# Patient Record
Sex: Male | Born: 1950 | Race: Black or African American | Hispanic: No | Smoking: Former smoker
Health system: Southern US, Community
[De-identification: ages and names within clinical notes are randomized; demographics above are authoritative.]

## PROBLEM LIST (undated history)

## (undated) DIAGNOSIS — T8859XA Other complications of anesthesia, initial encounter: Secondary | ICD-10-CM

## (undated) DIAGNOSIS — E785 Hyperlipidemia, unspecified: Secondary | ICD-10-CM

## (undated) DIAGNOSIS — F329 Major depressive disorder, single episode, unspecified: Secondary | ICD-10-CM

## (undated) DIAGNOSIS — N529 Male erectile dysfunction, unspecified: Secondary | ICD-10-CM

## (undated) DIAGNOSIS — R000 Tachycardia, unspecified: Secondary | ICD-10-CM

## (undated) DIAGNOSIS — I10 Essential (primary) hypertension: Secondary | ICD-10-CM

## (undated) DIAGNOSIS — R809 Proteinuria, unspecified: Secondary | ICD-10-CM

## (undated) DIAGNOSIS — C61 Malignant neoplasm of prostate: Secondary | ICD-10-CM

## (undated) DIAGNOSIS — G4733 Obstructive sleep apnea (adult) (pediatric): Secondary | ICD-10-CM

## (undated) DIAGNOSIS — L509 Urticaria, unspecified: Secondary | ICD-10-CM

## (undated) DIAGNOSIS — D649 Anemia, unspecified: Secondary | ICD-10-CM

## (undated) DIAGNOSIS — H409 Unspecified glaucoma: Secondary | ICD-10-CM

## (undated) DIAGNOSIS — H9190 Unspecified hearing loss, unspecified ear: Secondary | ICD-10-CM

## (undated) DIAGNOSIS — Z803 Family history of malignant neoplasm of breast: Secondary | ICD-10-CM

## (undated) DIAGNOSIS — C669 Malignant neoplasm of unspecified ureter: Secondary | ICD-10-CM

## (undated) DIAGNOSIS — F32A Depression, unspecified: Secondary | ICD-10-CM

## (undated) DIAGNOSIS — E119 Type 2 diabetes mellitus without complications: Secondary | ICD-10-CM

## (undated) DIAGNOSIS — K635 Polyp of colon: Secondary | ICD-10-CM

## (undated) DIAGNOSIS — K602 Anal fissure, unspecified: Secondary | ICD-10-CM

## (undated) DIAGNOSIS — Z8546 Personal history of malignant neoplasm of prostate: Secondary | ICD-10-CM

## (undated) DIAGNOSIS — Z8042 Family history of malignant neoplasm of prostate: Secondary | ICD-10-CM

## (undated) HISTORY — DX: Family history of malignant neoplasm of breast: Z80.3

## (undated) HISTORY — DX: Family history of malignant neoplasm of prostate: Z80.42

## (undated) HISTORY — DX: Personal history of malignant neoplasm of prostate: Z85.46

## (undated) HISTORY — DX: Tachycardia, unspecified: R00.0

## (undated) HISTORY — DX: Hyperlipidemia, unspecified: E78.5

## (undated) HISTORY — DX: Polyp of colon: K63.5

## (undated) HISTORY — DX: Anal fissure, unspecified: K60.2

## (undated) HISTORY — DX: Anemia, unspecified: D64.9

## (undated) HISTORY — PX: INSERTION PROSTATE RADIATION SEED: SUR718

## (undated) HISTORY — DX: Urticaria, unspecified: L50.9

## (undated) HISTORY — PX: APPENDECTOMY: SHX54

## (undated) HISTORY — DX: Malignant neoplasm of unspecified ureter: C66.9

## (undated) HISTORY — DX: Obstructive sleep apnea (adult) (pediatric): G47.33

## (undated) HISTORY — DX: Major depressive disorder, single episode, unspecified: F32.9

## (undated) HISTORY — DX: Type 2 diabetes mellitus without complications: E11.9

## (undated) HISTORY — DX: Depression, unspecified: F32.A

## (undated) HISTORY — DX: Male erectile dysfunction, unspecified: N52.9

## (undated) HISTORY — DX: Proteinuria, unspecified: R80.9

## (undated) HISTORY — DX: Malignant neoplasm of prostate: C61

## (undated) HISTORY — DX: Unspecified hearing loss, unspecified ear: H91.90

## (undated) HISTORY — PX: INCISIONAL HERNIA REPAIR: SHX193

---

## 1996-05-13 HISTORY — PX: APPENDECTOMY: SHX54

## 1996-05-13 HISTORY — PX: INCISIONAL HERNIA REPAIR: SHX193

## 2000-04-21 ENCOUNTER — Encounter: Admission: RE | Admit: 2000-04-21 | Discharge: 2000-04-21 | Payer: Self-pay | Admitting: Internal Medicine

## 2000-04-21 ENCOUNTER — Encounter: Payer: Self-pay | Admitting: Internal Medicine

## 2008-06-13 DIAGNOSIS — C61 Malignant neoplasm of prostate: Secondary | ICD-10-CM

## 2008-06-13 HISTORY — DX: Malignant neoplasm of prostate: C61

## 2008-10-15 ENCOUNTER — Ambulatory Visit: Admission: RE | Admit: 2008-10-15 | Discharge: 2008-12-10 | Payer: Self-pay | Admitting: Radiation Oncology

## 2008-12-01 ENCOUNTER — Ambulatory Visit (HOSPITAL_BASED_OUTPATIENT_CLINIC_OR_DEPARTMENT_OTHER): Admission: RE | Admit: 2008-12-01 | Discharge: 2008-12-02 | Payer: Self-pay | Admitting: Urology

## 2008-12-11 ENCOUNTER — Ambulatory Visit: Admission: RE | Admit: 2008-12-11 | Discharge: 2009-01-14 | Payer: Self-pay | Admitting: Radiation Oncology

## 2009-11-26 ENCOUNTER — Ambulatory Visit (HOSPITAL_COMMUNITY): Admission: RE | Admit: 2009-11-26 | Discharge: 2009-11-26 | Payer: Self-pay | Admitting: Gastroenterology

## 2010-09-20 LAB — COMPREHENSIVE METABOLIC PANEL
ALT: 35 U/L (ref 0–53)
AST: 32 U/L (ref 0–37)
Albumin: 3.9 g/dL (ref 3.5–5.2)
Alkaline Phosphatase: 85 U/L (ref 39–117)
BUN: 10 mg/dL (ref 6–23)
CO2: 23 mEq/L (ref 19–32)
Calcium: 9.1 mg/dL (ref 8.4–10.5)
Chloride: 106 mEq/L (ref 96–112)
Creatinine, Ser: 1 mg/dL (ref 0.4–1.5)
GFR calc Af Amer: >60 mL/min (ref >60)
GFR calc non Af Amer: >60 mL/min (ref >60)
Glucose, Bld: 149 mg/dL — ABNORMAL HIGH (ref 70–99)
Potassium: 3.8 mEq/L (ref 3.5–5.1)
Sodium: 138 mEq/L (ref 135–145)
Total Bilirubin: 0.6 mg/dL (ref 0.3–1.2)
Total Protein: 8.2 g/dL (ref 6.0–8.3)

## 2010-09-20 LAB — GLUCOSE, CAPILLARY: Glucose-Capillary: 160 mg/dL — ABNORMAL HIGH (ref 70–99)

## 2010-10-26 NOTE — Op Note (Signed)
NAMESYLYS, SOBIERAJ                ACCOUNT NO.:  [id number]   MEDICAL RECORD NO.:  [id number]          PATIENT TYPE:  AMB   LOCATION:  [facility]                         FACILITY:  [facility]   PHYSICIAN:  Hanley Ben, M.D.  DATE OF BIRTH:  1950-06-25   DATE OF PROCEDURE:  12/01/2008  DATE OF DISCHARGE:                               OPERATIVE REPORT   PREOPERATIVE DIAGNOSIS:  Adenocarcinoma of prostate.   POSTOPERATIVE DIAGNOSIS:  Adenocarcinoma of prostate.   PROCEDURE:  I-125 seeds implantation and cystoscopy.   SURGEON:  Arvil Persons, M.D. and Rexene Edison, M.D.   ANESTHESIA:  General.   INDICATIONS:  The patient is a 60 year old male who was found by biopsy  of prostate for elevated PSA to have adenocarcinoma of the prostate and  Gleason score 6.  The treatment options were discussed with him and he  chose to take to have brachytherapy.  He is scheduled today for seeds  implantation.   The patient was identified by his wrist band and proper time-out was  taken.   DESCRIPTION OF PROCEDURE:  Under general anesthesia he was prepped and  draped and placed in the dorsal lithotomy position.  Ultrasound planning  was done by Dr. Valere Dross and when planning was completed, with the  Nucletron and under ultrasound guidance,  a total of 77 seeds were  implanted in the prostate through 30 needles.  Fluoroscopy showed good  seed distribution.  The Foley catheter was then removed.  A flexible  cystoscope was then passed in the bladder.  The anterior urethra is  normal.  There is moderate prostatic hypertrophy.  The bladder mucosa is  normal.  There is no stone, seed or tumor in the bladder.  The ureteral  orifices are in normal position and shape.  The cystoscope was then  removed.  A #16 Foley catheter was then inserted in the bladder.   The patient tolerated the procedure well and left the OR in satisfactory  condition to post anesthesia care unit.      Hanley Ben, M.D.  Electronically Signed     MN/MEDQ  D:  12/01/2008  T:  12/01/2008  Job:  [id number]

## 2011-03-21 ENCOUNTER — Other Ambulatory Visit: Payer: Self-pay | Admitting: Internal Medicine

## 2011-04-05 ENCOUNTER — Ambulatory Visit
Admission: RE | Admit: 2011-04-05 | Discharge: 2011-04-05 | Disposition: A | Discharge status: Home / Self Care | Payer: Medicare Other | Source: Ambulatory Visit | Attending: Internal Medicine | Admitting: Internal Medicine

## 2011-08-22 DIAGNOSIS — H40019 Open angle with borderline findings, low risk, unspecified eye: Secondary | ICD-10-CM | POA: Diagnosis not present

## 2011-08-22 DIAGNOSIS — H43399 Other vitreous opacities, unspecified eye: Secondary | ICD-10-CM | POA: Diagnosis not present

## 2011-08-22 DIAGNOSIS — E119 Type 2 diabetes mellitus without complications: Secondary | ICD-10-CM | POA: Diagnosis not present

## 2011-08-22 DIAGNOSIS — H2510 Age-related nuclear cataract, unspecified eye: Secondary | ICD-10-CM | POA: Diagnosis not present

## 2011-09-19 DIAGNOSIS — G4733 Obstructive sleep apnea (adult) (pediatric): Secondary | ICD-10-CM | POA: Diagnosis not present

## 2011-09-19 DIAGNOSIS — E119 Type 2 diabetes mellitus without complications: Secondary | ICD-10-CM | POA: Diagnosis not present

## 2011-10-26 DIAGNOSIS — C61 Malignant neoplasm of prostate: Secondary | ICD-10-CM | POA: Diagnosis not present

## 2011-11-02 DIAGNOSIS — N529 Male erectile dysfunction, unspecified: Secondary | ICD-10-CM | POA: Diagnosis not present

## 2011-11-02 DIAGNOSIS — C61 Malignant neoplasm of prostate: Secondary | ICD-10-CM | POA: Diagnosis not present

## 2011-11-30 DIAGNOSIS — R000 Tachycardia, unspecified: Secondary | ICD-10-CM | POA: Diagnosis not present

## 2011-11-30 DIAGNOSIS — Z0389 Encounter for observation for other suspected diseases and conditions ruled out: Secondary | ICD-10-CM | POA: Diagnosis not present

## 2011-11-30 DIAGNOSIS — E7800 Pure hypercholesterolemia, unspecified: Secondary | ICD-10-CM | POA: Diagnosis not present

## 2011-11-30 DIAGNOSIS — E669 Obesity, unspecified: Secondary | ICD-10-CM | POA: Diagnosis not present

## 2012-02-20 DIAGNOSIS — H40019 Open angle with borderline findings, low risk, unspecified eye: Secondary | ICD-10-CM | POA: Diagnosis not present

## 2012-02-20 DIAGNOSIS — E119 Type 2 diabetes mellitus without complications: Secondary | ICD-10-CM | POA: Diagnosis not present

## 2012-03-21 DIAGNOSIS — Z0000 Encounter for general adult medical examination without abnormal findings: Secondary | ICD-10-CM | POA: Diagnosis not present

## 2012-03-21 DIAGNOSIS — Z79899 Other long term (current) drug therapy: Secondary | ICD-10-CM | POA: Diagnosis not present

## 2012-03-21 DIAGNOSIS — E119 Type 2 diabetes mellitus without complications: Secondary | ICD-10-CM | POA: Diagnosis not present

## 2012-03-21 DIAGNOSIS — G4733 Obstructive sleep apnea (adult) (pediatric): Secondary | ICD-10-CM | POA: Diagnosis not present

## 2012-03-21 DIAGNOSIS — K219 Gastro-esophageal reflux disease without esophagitis: Secondary | ICD-10-CM | POA: Diagnosis not present

## 2012-03-21 DIAGNOSIS — Z23 Encounter for immunization: Secondary | ICD-10-CM | POA: Diagnosis not present

## 2012-03-21 DIAGNOSIS — Z1331 Encounter for screening for depression: Secondary | ICD-10-CM | POA: Diagnosis not present

## 2012-05-07 DIAGNOSIS — C61 Malignant neoplasm of prostate: Secondary | ICD-10-CM | POA: Diagnosis not present

## 2012-05-07 DIAGNOSIS — N529 Male erectile dysfunction, unspecified: Secondary | ICD-10-CM | POA: Diagnosis not present

## 2012-08-20 DIAGNOSIS — H40019 Open angle with borderline findings, low risk, unspecified eye: Secondary | ICD-10-CM | POA: Diagnosis not present

## 2012-09-25 DIAGNOSIS — E119 Type 2 diabetes mellitus without complications: Secondary | ICD-10-CM | POA: Diagnosis not present

## 2012-09-25 DIAGNOSIS — R109 Unspecified abdominal pain: Secondary | ICD-10-CM | POA: Diagnosis not present

## 2012-09-25 DIAGNOSIS — G4733 Obstructive sleep apnea (adult) (pediatric): Secondary | ICD-10-CM | POA: Diagnosis not present

## 2012-11-07 DIAGNOSIS — C61 Malignant neoplasm of prostate: Secondary | ICD-10-CM | POA: Diagnosis not present

## 2012-11-07 DIAGNOSIS — N529 Male erectile dysfunction, unspecified: Secondary | ICD-10-CM | POA: Diagnosis not present

## 2012-11-13 DIAGNOSIS — E119 Type 2 diabetes mellitus without complications: Secondary | ICD-10-CM | POA: Diagnosis not present

## 2012-11-13 DIAGNOSIS — K5289 Other specified noninfective gastroenteritis and colitis: Secondary | ICD-10-CM | POA: Diagnosis not present

## 2012-12-05 DIAGNOSIS — E7800 Pure hypercholesterolemia, unspecified: Secondary | ICD-10-CM | POA: Diagnosis not present

## 2012-12-05 DIAGNOSIS — E669 Obesity, unspecified: Secondary | ICD-10-CM | POA: Diagnosis not present

## 2012-12-05 DIAGNOSIS — R000 Tachycardia, unspecified: Secondary | ICD-10-CM | POA: Diagnosis not present

## 2013-02-21 DIAGNOSIS — H43819 Vitreous degeneration, unspecified eye: Secondary | ICD-10-CM | POA: Diagnosis not present

## 2013-02-21 DIAGNOSIS — H2510 Age-related nuclear cataract, unspecified eye: Secondary | ICD-10-CM | POA: Diagnosis not present

## 2013-02-21 DIAGNOSIS — H40019 Open angle with borderline findings, low risk, unspecified eye: Secondary | ICD-10-CM | POA: Diagnosis not present

## 2013-02-21 DIAGNOSIS — E119 Type 2 diabetes mellitus without complications: Secondary | ICD-10-CM | POA: Diagnosis not present

## 2013-03-26 DIAGNOSIS — G4733 Obstructive sleep apnea (adult) (pediatric): Secondary | ICD-10-CM | POA: Diagnosis not present

## 2013-03-26 DIAGNOSIS — Z0000 Encounter for general adult medical examination without abnormal findings: Secondary | ICD-10-CM | POA: Diagnosis not present

## 2013-03-26 DIAGNOSIS — E119 Type 2 diabetes mellitus without complications: Secondary | ICD-10-CM | POA: Diagnosis not present

## 2013-03-26 DIAGNOSIS — R030 Elevated blood-pressure reading, without diagnosis of hypertension: Secondary | ICD-10-CM | POA: Diagnosis not present

## 2013-03-26 DIAGNOSIS — Z1331 Encounter for screening for depression: Secondary | ICD-10-CM | POA: Diagnosis not present

## 2013-06-28 DIAGNOSIS — Z23 Encounter for immunization: Secondary | ICD-10-CM | POA: Diagnosis not present

## 2013-07-11 DIAGNOSIS — E119 Type 2 diabetes mellitus without complications: Secondary | ICD-10-CM | POA: Diagnosis not present

## 2013-09-16 DIAGNOSIS — H40019 Open angle with borderline findings, low risk, unspecified eye: Secondary | ICD-10-CM | POA: Diagnosis not present

## 2013-09-16 DIAGNOSIS — H2510 Age-related nuclear cataract, unspecified eye: Secondary | ICD-10-CM | POA: Diagnosis not present

## 2013-09-16 DIAGNOSIS — E119 Type 2 diabetes mellitus without complications: Secondary | ICD-10-CM | POA: Diagnosis not present

## 2013-10-09 DIAGNOSIS — E119 Type 2 diabetes mellitus without complications: Secondary | ICD-10-CM | POA: Diagnosis not present

## 2013-10-09 DIAGNOSIS — Z713 Dietary counseling and surveillance: Secondary | ICD-10-CM | POA: Diagnosis not present

## 2013-10-09 DIAGNOSIS — Z0000 Encounter for general adult medical examination without abnormal findings: Secondary | ICD-10-CM | POA: Diagnosis not present

## 2013-10-09 DIAGNOSIS — Z6841 Body Mass Index (BMI) 40.0 and over, adult: Secondary | ICD-10-CM | POA: Diagnosis not present

## 2013-10-09 DIAGNOSIS — E669 Obesity, unspecified: Secondary | ICD-10-CM | POA: Diagnosis not present

## 2013-10-09 DIAGNOSIS — G4733 Obstructive sleep apnea (adult) (pediatric): Secondary | ICD-10-CM | POA: Diagnosis not present

## 2013-10-15 ENCOUNTER — Encounter: Payer: Self-pay | Admitting: Cardiology

## 2013-12-03 ENCOUNTER — Ambulatory Visit: Payer: Medicare Other | Admitting: Cardiology

## 2013-12-10 ENCOUNTER — Ambulatory Visit: Payer: Medicare Other | Admitting: Cardiology

## 2013-12-19 DIAGNOSIS — N529 Male erectile dysfunction, unspecified: Secondary | ICD-10-CM | POA: Diagnosis not present

## 2013-12-19 DIAGNOSIS — C61 Malignant neoplasm of prostate: Secondary | ICD-10-CM | POA: Diagnosis not present

## 2013-12-27 ENCOUNTER — Other Ambulatory Visit: Payer: Self-pay

## 2013-12-27 ENCOUNTER — Telehealth: Payer: Self-pay

## 2013-12-27 MED ORDER — DILTIAZEM HCL ER 240 MG PO CP24: 240.0000 mg | ORAL_CAPSULE | Freq: Every day | ORAL | Status: DC

## 2013-12-27 NOTE — Telephone Encounter (Signed)
Yes ok to refill 

## 2014-01-14 ENCOUNTER — Encounter: Payer: Self-pay | Admitting: Cardiology

## 2014-01-14 ENCOUNTER — Ambulatory Visit (INDEPENDENT_AMBULATORY_CARE_PROVIDER_SITE_OTHER): Payer: Medicare Other | Admitting: Cardiology

## 2014-01-14 VITALS — BP 140/72 | HR 96 | Ht 68.0 in | Wt 294.0 lb

## 2014-01-14 DIAGNOSIS — E785 Hyperlipidemia, unspecified: Secondary | ICD-10-CM | POA: Diagnosis not present

## 2014-01-14 DIAGNOSIS — R000 Tachycardia, unspecified: Secondary | ICD-10-CM | POA: Insufficient documentation

## 2014-01-14 NOTE — Patient Instructions (Signed)
The current medical regimen is effective;  continue present plan and medications.  Follow up in 1 year with Dr Skains.  You will receive a letter in the mail 2 months before you are due.  Please call us when you receive this letter to schedule your follow up appointment.  

## 2014-01-14 NOTE — Progress Notes (Signed)
[place]. [address] [place], [place]  [address] Phone: [phone] Fax:  [phone]  Date:  01/14/2014   ID:  Kevin Morgan, Kevin Morgan 05/31/1951, MRN [id number]  PCP:  Horton Finer, MD   History of Present Illness: Kevin Morgan is a 63 y.o. male hyperlipidemia, tachycardia, pulse was 110 at original clinic visit. Echocardiogram was reassuring with normal ejection fraction. Holter monitor (03/2009) showed average heart rate of 97 beats per minute. He states that since the diltiazem he is feeling better. He is not waking up in the middle of night with his heart pounding. Now he is feeling stronger with more "Zip in his step". No chest pain, no syncope, no SOB. Decreased simvastatin to 10mg . Feels real good he says. 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   Wt Readings from Last 3 Encounters:  01/14/14 294 lb (133.358 kg)     Past Medical History  Diagnosis Date  . Sleep apnea, obstructive   . Diabetes mellitus without complication   . Proteinuria   . Prostate cancer 2010    seed implants  . ED (erectile dysfunction)   . Depression   . Urticaria   . Hyperlipidemia   . Anal fissure   . Colon polyps 2008/2011  . Tachycardia   . Anemia   . Hearing loss     No past surgical history on file.  Current Outpatient Prescriptions  Medication Sig Dispense Refill  . aspirin 81 MG tablet Take 81 mg by mouth daily.      Marland Kitchen aspirin-sod bicarb-citric acid (ALKA-SELTZER) 325 MG TBEF tablet Take 325 mg by mouth every 6 (six) hours as needed.      . Calcium Carb-Cholecalciferol (CALCIUM 600 + D PO) Take by mouth.      . cyanocobalamin 1000 MCG tablet Take 100 mcg by mouth daily.      Marland Kitchen diltiazem (DILACOR XR) 240 MG 24 hr capsule Take 240 mg by mouth daily.      Marland Kitchen glimepiride (AMARYL) 2 MG tablet Take 2 mg by mouth daily with breakfast.      . ibuprofen (ADVIL,MOTRIN) 200 MG tablet Take 200 mg by mouth every 6 (six) hours as needed.       . loratadine (CLARITIN) 10 MG tablet Take 10 mg by mouth daily.      . metFORMIN (GLUCOPHAGE) 500 MG tablet Take 500 mg by mouth 2 (two) times daily with a meal.      . omeprazole (PRILOSEC) 20 MG capsule Take 20 mg by mouth daily.      . simvastatin (ZOCOR) 10 MG tablet Take 10 mg by mouth daily.      . tadalafil (CIALIS) 5 MG tablet Take 5 mg by mouth daily as needed for erectile dysfunction.       No current facility-administered medications for this visit.    Allergies:    Allergies  Allergen Reactions  . Cinnamon   . Hydrocodone-Acetaminophen Swelling  . Naprosyn [Naproxen]     Social History:  The patient  reports that he has quit smoking. He does not have any smokeless tobacco history on file.   Family History  Problem Relation Age of Onset  . Cancer - Colon Father   . Cancer - Colon Brother   . Cancer - Colon Maternal Uncle     ROS:  Please see the history of present illness.   Denies any strokelike symptoms, chest pain,  orthopnea, PND   All other systems reviewed and negative.   PHYSICAL EXAM: VS:  BP 140/72  Pulse 96  Ht 5\' 8"  (1.727 m)  Wt 294 lb (133.358 kg)  BMI 44.71 kg/m2 Well nourished, well developed, in no acute distress HEENT: normal, [place]/AT, EOMI Neck: no JVD, normal carotid upstroke, no bruit Cardiac:  normal S1, S2; RRR; no murmur Lungs:  clear to auscultation bilaterally, no wheezing, rhonchi or rales Abd: soft, nontender, no hepatomegaly, no bruitsobese Ext: no edema, 2+ distal pulses Skin: warm and dry GU: deferred Neuro: no focal abnormalities noted, AAO x 3  EKG:  01/14/14-sinus rhythm, 96, nonspecific ST-T wave changes, no change from prior  Echocardiogram: 10/14-normal ejection fraction, structurally normal Holter monitor: 10/14-average heart rate 97 beats per minute. No adverse arrhythmias.  ASSESSMENT AND PLAN:  1. Tachycardia-originally in 110 beats per minute. Medications as above. No changes. 2. Morbid obesity-continue to  encourage weight loss. Decrease carbohydrates. Discussed. He is interested in getting a stair stepper/ climber.  3. Hyperlipidemia-he had decreased his simvastatin to 10 mg because of difficulty tolerating medication dose. He is overall doing well. 4. One year followup  Signed, Candee Furbish, MD [facility]  01/14/2014 10:05 AM

## 2014-02-27 DIAGNOSIS — H2510 Age-related nuclear cataract, unspecified eye: Secondary | ICD-10-CM | POA: Diagnosis not present

## 2014-02-27 DIAGNOSIS — H40019 Open angle with borderline findings, low risk, unspecified eye: Secondary | ICD-10-CM | POA: Diagnosis not present

## 2014-02-27 DIAGNOSIS — E119 Type 2 diabetes mellitus without complications: Secondary | ICD-10-CM | POA: Diagnosis not present

## 2014-03-27 DIAGNOSIS — Z8546 Personal history of malignant neoplasm of prostate: Secondary | ICD-10-CM | POA: Diagnosis not present

## 2014-03-27 DIAGNOSIS — Z0000 Encounter for general adult medical examination without abnormal findings: Secondary | ICD-10-CM | POA: Diagnosis not present

## 2014-03-27 DIAGNOSIS — E119 Type 2 diabetes mellitus without complications: Secondary | ICD-10-CM | POA: Diagnosis not present

## 2014-03-27 DIAGNOSIS — Z1211 Encounter for screening for malignant neoplasm of colon: Secondary | ICD-10-CM | POA: Diagnosis not present

## 2014-03-27 DIAGNOSIS — E6609 Other obesity due to excess calories: Secondary | ICD-10-CM | POA: Diagnosis not present

## 2014-03-27 DIAGNOSIS — Z1389 Encounter for screening for other disorder: Secondary | ICD-10-CM | POA: Diagnosis not present

## 2014-03-27 DIAGNOSIS — E780 Pure hypercholesterolemia: Secondary | ICD-10-CM | POA: Diagnosis not present

## 2014-03-27 DIAGNOSIS — Z136 Encounter for screening for cardiovascular disorders: Secondary | ICD-10-CM | POA: Diagnosis not present

## 2014-03-27 DIAGNOSIS — Z23 Encounter for immunization: Secondary | ICD-10-CM | POA: Diagnosis not present

## 2014-04-08 ENCOUNTER — Other Ambulatory Visit: Payer: Self-pay | Admitting: Internal Medicine

## 2014-04-08 DIAGNOSIS — Z139 Encounter for screening, unspecified: Secondary | ICD-10-CM

## 2014-04-16 ENCOUNTER — Ambulatory Visit
Admission: RE | Admit: 2014-04-16 | Discharge: 2014-04-16 | Disposition: A | Discharge status: Home / Self Care | Payer: Medicare Other | Source: Ambulatory Visit | Attending: Internal Medicine | Admitting: Internal Medicine

## 2014-04-16 DIAGNOSIS — Z136 Encounter for screening for cardiovascular disorders: Secondary | ICD-10-CM | POA: Diagnosis not present

## 2014-04-16 DIAGNOSIS — Z139 Encounter for screening, unspecified: Secondary | ICD-10-CM

## 2014-04-20 ENCOUNTER — Encounter: Payer: Self-pay | Admitting: *Deleted

## 2014-06-26 ENCOUNTER — Other Ambulatory Visit: Payer: Self-pay | Admitting: Gastroenterology

## 2014-07-29 ENCOUNTER — Other Ambulatory Visit: Payer: Self-pay | Admitting: Cardiology

## 2014-08-07 DIAGNOSIS — L989 Disorder of the skin and subcutaneous tissue, unspecified: Secondary | ICD-10-CM | POA: Diagnosis not present

## 2014-08-07 DIAGNOSIS — E780 Pure hypercholesterolemia: Secondary | ICD-10-CM | POA: Diagnosis not present

## 2014-08-07 DIAGNOSIS — E119 Type 2 diabetes mellitus without complications: Secondary | ICD-10-CM | POA: Diagnosis not present

## 2014-08-07 DIAGNOSIS — D179 Benign lipomatous neoplasm, unspecified: Secondary | ICD-10-CM | POA: Diagnosis not present

## 2014-08-28 DIAGNOSIS — L720 Epidermal cyst: Secondary | ICD-10-CM | POA: Diagnosis not present

## 2014-08-28 DIAGNOSIS — L810 Postinflammatory hyperpigmentation: Secondary | ICD-10-CM | POA: Diagnosis not present

## 2014-08-28 DIAGNOSIS — L820 Inflamed seborrheic keratosis: Secondary | ICD-10-CM | POA: Diagnosis not present

## 2014-09-01 DIAGNOSIS — E119 Type 2 diabetes mellitus without complications: Secondary | ICD-10-CM | POA: Diagnosis not present

## 2014-09-01 DIAGNOSIS — H2513 Age-related nuclear cataract, bilateral: Secondary | ICD-10-CM | POA: Diagnosis not present

## 2014-09-01 DIAGNOSIS — H40013 Open angle with borderline findings, low risk, bilateral: Secondary | ICD-10-CM | POA: Diagnosis not present

## 2014-09-08 ENCOUNTER — Encounter (HOSPITAL_COMMUNITY): Payer: Self-pay | Admitting: *Deleted

## 2014-09-23 ENCOUNTER — Ambulatory Visit (HOSPITAL_COMMUNITY): Payer: Medicare Other | Admitting: Anesthesiology

## 2014-09-23 ENCOUNTER — Encounter (HOSPITAL_COMMUNITY)
Admission: RE | Disposition: A | Discharge status: Home / Self Care | Payer: Self-pay | Source: Ambulatory Visit | Attending: Gastroenterology

## 2014-09-23 ENCOUNTER — Ambulatory Visit (HOSPITAL_COMMUNITY)
Admission: RE | Admit: 2014-09-23 | Discharge: 2014-09-23 | Disposition: A | Discharge status: Home / Self Care | Payer: Medicare Other | Source: Ambulatory Visit | Attending: Gastroenterology | Admitting: Gastroenterology

## 2014-09-23 ENCOUNTER — Encounter (HOSPITAL_COMMUNITY): Payer: Self-pay | Admitting: Gastroenterology

## 2014-09-23 DIAGNOSIS — Z8546 Personal history of malignant neoplasm of prostate: Secondary | ICD-10-CM | POA: Diagnosis not present

## 2014-09-23 DIAGNOSIS — H9190 Unspecified hearing loss, unspecified ear: Secondary | ICD-10-CM | POA: Diagnosis not present

## 2014-09-23 DIAGNOSIS — K219 Gastro-esophageal reflux disease without esophagitis: Secondary | ICD-10-CM | POA: Diagnosis not present

## 2014-09-23 DIAGNOSIS — K5730 Diverticulosis of large intestine without perforation or abscess without bleeding: Secondary | ICD-10-CM | POA: Insufficient documentation

## 2014-09-23 DIAGNOSIS — Z6841 Body Mass Index (BMI) 40.0 and over, adult: Secondary | ICD-10-CM | POA: Diagnosis not present

## 2014-09-23 DIAGNOSIS — K5790 Diverticulosis of intestine, part unspecified, without perforation or abscess without bleeding: Secondary | ICD-10-CM | POA: Diagnosis not present

## 2014-09-23 DIAGNOSIS — Z86010 Personal history of colonic polyps: Secondary | ICD-10-CM | POA: Insufficient documentation

## 2014-09-23 DIAGNOSIS — Z9089 Acquired absence of other organs: Secondary | ICD-10-CM | POA: Diagnosis not present

## 2014-09-23 DIAGNOSIS — E1169 Type 2 diabetes mellitus with other specified complication: Secondary | ICD-10-CM | POA: Diagnosis not present

## 2014-09-23 DIAGNOSIS — Z1211 Encounter for screening for malignant neoplasm of colon: Secondary | ICD-10-CM | POA: Diagnosis not present

## 2014-09-23 DIAGNOSIS — Z87891 Personal history of nicotine dependence: Secondary | ICD-10-CM | POA: Insufficient documentation

## 2014-09-23 DIAGNOSIS — G4733 Obstructive sleep apnea (adult) (pediatric): Secondary | ICD-10-CM | POA: Diagnosis not present

## 2014-09-23 DIAGNOSIS — Z09 Encounter for follow-up examination after completed treatment for conditions other than malignant neoplasm: Secondary | ICD-10-CM | POA: Diagnosis present

## 2014-09-23 HISTORY — PX: COLONOSCOPY WITH PROPOFOL: SHX5780

## 2014-09-23 HISTORY — DX: Essential (primary) hypertension: I10

## 2014-09-23 LAB — GLUCOSE, CAPILLARY: Glucose-Capillary: 108 mg/dL — ABNORMAL HIGH (ref 70–99)

## 2014-09-23 SURGERY — COLONOSCOPY WITH PROPOFOL
Anesthesia: Monitor Anesthesia Care

## 2014-09-23 MED ORDER — LACTATED RINGERS IV SOLN
INTRAVENOUS | Status: DC
  Administered 2014-09-23: 12:00:00 via INTRAVENOUS

## 2014-09-23 MED ORDER — PROPOFOL 10 MG/ML IV BOLUS
INTRAVENOUS | Status: AC
  Filled 2014-09-23: qty 20

## 2014-09-23 MED ORDER — PROMETHAZINE HCL 25 MG/ML IJ SOLN
6.2500 mg | INTRAMUSCULAR | Status: DC | PRN
Start: 2014-09-23 — End: 2014-09-23

## 2014-09-23 MED ORDER — PROPOFOL 10 MG/ML IV BOLUS
INTRAVENOUS | Status: DC | PRN
  Administered 2014-09-23 (×2): 20 mg via INTRAVENOUS
  Administered 2014-09-23: 50 mg via INTRAVENOUS
  Administered 2014-09-23 (×2): 30 mg via INTRAVENOUS
  Administered 2014-09-23: 20 mg via INTRAVENOUS
  Administered 2014-09-23: 50 mg via INTRAVENOUS

## 2014-09-23 MED ORDER — SODIUM CHLORIDE 0.9 % IV SOLN: INTRAVENOUS | Status: DC

## 2014-09-23 SURGICAL SUPPLY — 22 items

## 2014-09-23 NOTE — Transfer of Care (Signed)
Immediate Anesthesia Transfer of Care Note  Patient: Kevin Morgan  Procedure(s) Performed: Procedure(s): COLONOSCOPY WITH PROPOFOL (N/A)  Patient Location: PACU  Anesthesia Type:MAC  Level of Consciousness: sedated  Airway & Oxygen Therapy: Patient Spontanous Breathing and Patient connected to nasal cannula oxygen  Post-op Assessment: Report given to RN and Post -op Vital signs reviewed and stable  Post vital signs: Reviewed and stable  Last Vitals:  Filed Vitals:   09/23/14 1137  BP: 166/76  Pulse: 98  Temp: 36.8 C  Resp: 22    Complications: No apparent anesthesia complications

## 2014-09-23 NOTE — Anesthesia Postprocedure Evaluation (Signed)
  Anesthesia Post-op Note  Patient: Kevin Morgan  Procedure(s) Performed: Procedure(s) (LRB): COLONOSCOPY WITH PROPOFOL (N/A)  Patient Location: PACU  Anesthesia Type: MAC  Level of Consciousness: awake and alert   Airway and Oxygen Therapy: Patient Spontanous Breathing  Post-op Pain: mild  Post-op Assessment: Post-op Vital signs reviewed, Patient's Cardiovascular Status Stable, Respiratory Function Stable, Patent Airway and No signs of Nausea or vomiting  Last Vitals:  Filed Vitals:   09/23/14 1331  BP: 120/58  Pulse:   Temp:   Resp:     Post-op Vital Signs: stable   Complications: No apparent anesthesia complications

## 2014-09-23 NOTE — H&P (Signed)
  Procedure: Surveillance colonoscopy. 11/26/2009 normal surveillance colonoscopy. History of adenomatous colon polyps removed colonoscopically. Obstructive sleep apnea syndrome associated with a BMI 47  History: The patient is a 64 year old male born Mar 02, 1951. He is scheduled to undergo a surveillance colonoscopy today.  Past medical history: Obstructive sleep apnea syndrome. BMI 47. Type 2 diabetes mellitus with proteinuria. Prostate cancer treated with seed implants. Hypercholesterolemia. Hearing loss. Appendectomy. Incisional herniorrhaphy.  Medication allergies: Naprosyn, hydrocodone with acetaminophen.  Exam: The patient is alert and lying comfortably on the endoscopy stretcher. Abdomen is obese and difficult to examine. Lungs are clear to auscultation. Cardiac exam reveals a regular rhythm.  Plan: Proceed with surveillance colonoscopy

## 2014-09-23 NOTE — Anesthesia Preprocedure Evaluation (Signed)
Anesthesia Evaluation  Patient identified by MRN, date of birth, ID band Patient awake    Reviewed: Allergy & Precautions, NPO status , Patient's Chart, lab work & pertinent test results  Airway Mallampati: III  TM Distance: <3 FB Neck ROM: Full    Dental no notable dental hx.    Pulmonary sleep apnea , former smoker,  breath sounds clear to auscultation  Pulmonary exam normal       Cardiovascular negative cardio ROS  Rhythm:Regular Rate:Normal     Neuro/Psych negative neurological ROS  negative psych ROS   GI/Hepatic Neg liver ROS, GERD-  Medicated,  Endo/Other  diabetesMorbid obesity  Renal/GU negative Renal ROS  negative genitourinary   Musculoskeletal negative musculoskeletal ROS (+)   Abdominal   Peds negative pediatric ROS (+)  Hematology negative hematology ROS (+)   Anesthesia Other Findings   Reproductive/Obstetrics negative OB ROS                             Anesthesia Physical Anesthesia Plan  ASA: III  Anesthesia Plan: MAC   Post-op Pain Management:    Induction: Intravenous  Airway Management Planned: Simple Face Mask  Additional Equipment:   Intra-op Plan:   Post-operative Plan:   Informed Consent: I have reviewed the patients History and Physical, chart, labs and discussed the procedure including the risks, benefits and alternatives for the proposed anesthesia with the patient or authorized representative who has indicated his/her understanding and acceptance.   Dental advisory given  Plan Discussed with: Surgeon and CRNA  Anesthesia Plan Comments:         Anesthesia Quick Evaluation

## 2014-09-23 NOTE — Discharge Instructions (Signed)

## 2014-09-23 NOTE — Op Note (Signed)
Procedure: Surveillance colonoscopy. 11/26/2009 normal surveillance colonoscopy. History of adenomatous colon polyps removed colonoscopically in the past.  Endoscopist: Earle Gell  Premedication: Propofol administered by anesthesia  Procedure: The patient was placed in the left lateral decubitus position. Anal inspection and digital rectal exam were normal. The Pentax pediatric colonoscope was introduced into the rectum and advanced to the cecum. A normal-appearing ileocecal valve and appendiceal orifice were identified. Colonic preparation for the exam today was good. Withdrawal  time was 8 minutes  Rectum. Normal. Retroflexed view of the distal rectum normal  Sigmoid colon and descending colon. Left colonic diverticulosis  Splenic flexure. Normal  Transverse colon. Normal  Hepatic flexure. Normal  Ascending colon. Normal  Cecum and ileocecal valve. Normal  Assessment: Normal surveillance colonoscopy  Recommendation: Schedule repeat surveillance colonoscopy in approximately 5 years

## 2014-09-24 ENCOUNTER — Encounter (HOSPITAL_COMMUNITY): Payer: Self-pay | Admitting: Gastroenterology

## 2014-12-17 DIAGNOSIS — C61 Malignant neoplasm of prostate: Secondary | ICD-10-CM | POA: Diagnosis not present

## 2014-12-23 ENCOUNTER — Other Ambulatory Visit: Payer: Self-pay | Admitting: Cardiology

## 2014-12-23 ENCOUNTER — Other Ambulatory Visit: Payer: Self-pay

## 2014-12-23 MED ORDER — SIMVASTATIN 10 MG PO TABS: 10.0000 mg | ORAL_TABLET | Freq: Every day | ORAL | Status: DC

## 2014-12-25 DIAGNOSIS — N529 Male erectile dysfunction, unspecified: Secondary | ICD-10-CM | POA: Diagnosis not present

## 2014-12-25 DIAGNOSIS — C61 Malignant neoplasm of prostate: Secondary | ICD-10-CM | POA: Diagnosis not present

## 2015-02-17 ENCOUNTER — Other Ambulatory Visit: Payer: Self-pay | Admitting: Cardiology

## 2015-02-23 ENCOUNTER — Ambulatory Visit: Payer: Medicare Other | Admitting: Cardiology

## 2015-02-27 ENCOUNTER — Ambulatory Visit (INDEPENDENT_AMBULATORY_CARE_PROVIDER_SITE_OTHER): Payer: Medicare Other | Admitting: Cardiology

## 2015-02-27 ENCOUNTER — Encounter: Payer: Self-pay | Admitting: Cardiology

## 2015-02-27 VITALS — BP 132/78 | HR 68 | Ht 66.0 in | Wt 281.8 lb

## 2015-02-27 DIAGNOSIS — R000 Tachycardia, unspecified: Secondary | ICD-10-CM

## 2015-02-27 NOTE — Patient Instructions (Signed)
Medication Instructions:  The current medical regimen is effective;  continue present plan and medications.  Follow-Up: Follow up in 1 year with Dr. Skains.  You will receive a letter in the mail 2 months before you are due.  Please call us when you receive this letter to schedule your follow up appointment.  Thank you for choosing [facility]!!     

## 2015-02-27 NOTE — Progress Notes (Signed)
[place]. [address] [place], [place]  [address] Phone: [phone] Fax:  [phone]  Date:  02/27/2015   ID:  Kevin, Morgan 1951/06/12, MRN [id number]  PCP:  Kevin Heckle, MD   History of Present Illness: Kevin Morgan is a 64 y.o. male hyperlipidemia, tachycardia, pulse was 110 at original clinic visit. Echocardiogram was reassuring with normal ejection fraction. Holter monitor (03/2009) showed average heart rate of 97 beats per minute. He states that since the diltiazem he is feeling better. He is not waking up in the middle of night with his heart pounding. Now he is feeling stronger with more "Zip in his step". No chest pain, no syncope, no SOB. Decreased simvastatin to 10mg . 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  Doing well.   Wt Readings from Last 3 Encounters:  02/27/15 281 lb 12.8 oz (127.824 kg)  09/23/14 298 lb (135.172 kg)  01/14/14 294 lb (133.358 kg)     Past Medical History  Diagnosis Date  . Diabetes mellitus without complication   . Proteinuria   . Prostate cancer 2010    seed implants  . ED (erectile dysfunction)   . Depression   . Urticaria   . Hyperlipidemia   . Anal fissure   . Colon polyps 2008/2011  . Tachycardia   . Anemia   . Hearing loss   . Sleep apnea, obstructive     cpap used  . Hypertension     Past Surgical History  Procedure Laterality Date  . Appendectomy      mesh used- ruptured  appendix  . Incisional hernia repair      s/p appendectomy-open  . Colonoscopy with propofol N/A 09/23/2014    Procedure: COLONOSCOPY WITH PROPOFOL;  Surgeon: [name], MD;  Location: [facility];  Service: Endoscopy;  Laterality: N/A;    Current Outpatient Prescriptions  Medication Sig Dispense Refill  . acetaminophen (TYLENOL) 650 MG CR tablet Take 650 mg by mouth every 8 (eight) hours as needed for pain.    Marland Kitchen aspirin 81 MG tablet Take 81 mg by mouth every morning.     Marland Kitchen  aspirin-sod bicarb-citric acid (ALKA-SELTZER) 325 MG TBEF tablet Take 325 mg by mouth every 6 (six) hours as needed (indigestion.).     Marland Kitchen cyanocobalamin 1000 MCG tablet Take 100 mcg by mouth every morning.     . diltiazem (DILACOR XR) 240 MG 24 hr capsule Take 240 mg by mouth every morning. @ 4am.    . fexofenadine (ALLEGRA) 180 MG tablet Take 180 mg by mouth at bedtime.    Marland Kitchen glimepiride (AMARYL) 2 MG tablet Take 2 mg by mouth every evening.     . loratadine (CLARITIN) 10 MG tablet Take 10 mg by mouth every morning.     . metFORMIN (GLUCOPHAGE) 500 MG tablet Take 1,000 mg by mouth 2 (two) times daily.     Marland Kitchen omeprazole (PRILOSEC) 20 MG capsule Take 20 mg by mouth every morning.     . simvastatin (ZOCOR) 10 MG tablet Take 1 tablet (10 mg total) by mouth at bedtime. 30 tablet 3  . sodium chloride (OCEAN) 0.65 % SOLN nasal spray Place 1 spray into both nostrils as needed for congestion.    . tadalafil (CIALIS) 5 MG tablet Take 5 mg by mouth at bedtime.      No current facility-administered medications for this visit.    Allergies:  Allergies  Allergen Reactions  . Cinnamon Other (See Comments)    Lips and eye swelling.   Marland Kitchen Hydrocodone-Acetaminophen Swelling  . Naprosyn [Naproxen] Hives    Social History:  The patient  reports that he quit smoking about 46 years ago. His smoking use included Cigarettes. He does not have any smokeless tobacco history on file. He reports that he does not drink alcohol or use illicit drugs.   Family History  Problem Relation Age of Onset  . Cancer - Colon Father   . Cancer - Colon Brother   . Cancer - Colon Maternal Uncle     ROS:  Please see the history of present illness.   Denies any strokelike symptoms, chest pain, orthopnea, PND   All other systems reviewed and negative.   PHYSICAL EXAM: VS:  BP 132/78 mmHg  Pulse 68  Ht 5\' 6"  (1.676 m)  Wt 281 lb 12.8 oz (127.824 kg)  BMI 45.51 kg/m2   Well nourished, well developed, in no acute  distress HEENT: normal, [place]/AT, EOMI Neck: no JVD, normal carotid upstroke, no bruit Cardiac:  normal S1, S2; RRR; no murmur Lungs:  clear to auscultation bilaterally, no wheezing, rhonchi or rales Abd: soft, nontender, no hepatomegaly, no bruitsobese Ext: no edema, 2+ distal pulses Skin: warm and dry GU: deferred Neuro: no focal abnormalities noted, AAO x 3  EKG: Today 02/27/15 - NSR, personally viewed 01/14/14-sinus rhythm, 96, nonspecific ST-T wave changes, no change from prior  Echocardiogram: 10/14-normal ejection fraction, structurally normal Holter monitor: 10/14-average heart rate 97 beats per minute. No adverse arrhythmias.  ASSESSMENT AND PLAN:  1. Tachycardia-originally in 110 beats per minute. Medications as above. No changes. Doing well with diltiazem. His heart rate currently is in the 60s. 2. Morbid obesity-continue to encourage weight loss. Decrease carbohydrates. Discussed. 3. Hyperlipidemia- simvastatin to 10 mg because of difficulty tolerating medication dose. 105. He is overall doing well. 4. One year followup  Signed, Candee Furbish, MD [facility]  02/27/2015 11:33 AM

## 2015-03-06 DIAGNOSIS — H40013 Open angle with borderline findings, low risk, bilateral: Secondary | ICD-10-CM | POA: Diagnosis not present

## 2015-03-24 ENCOUNTER — Other Ambulatory Visit: Payer: Self-pay | Admitting: Cardiology

## 2015-04-01 DIAGNOSIS — Z0000 Encounter for general adult medical examination without abnormal findings: Secondary | ICD-10-CM | POA: Diagnosis not present

## 2015-04-01 DIAGNOSIS — Z23 Encounter for immunization: Secondary | ICD-10-CM | POA: Diagnosis not present

## 2015-04-01 DIAGNOSIS — Z1389 Encounter for screening for other disorder: Secondary | ICD-10-CM | POA: Diagnosis not present

## 2015-04-01 DIAGNOSIS — E119 Type 2 diabetes mellitus without complications: Secondary | ICD-10-CM | POA: Diagnosis not present

## 2015-04-01 DIAGNOSIS — Z6841 Body Mass Index (BMI) 40.0 and over, adult: Secondary | ICD-10-CM | POA: Diagnosis not present

## 2015-04-01 DIAGNOSIS — E7800 Pure hypercholesterolemia, unspecified: Secondary | ICD-10-CM | POA: Diagnosis not present

## 2015-04-01 DIAGNOSIS — R000 Tachycardia, unspecified: Secondary | ICD-10-CM | POA: Diagnosis not present

## 2015-04-29 ENCOUNTER — Other Ambulatory Visit: Payer: Self-pay | Admitting: Cardiology

## 2015-07-31 ENCOUNTER — Other Ambulatory Visit: Payer: Self-pay | Admitting: *Deleted

## 2015-07-31 MED ORDER — DILTIAZEM HCL ER 240 MG PO CP24: 240.0000 mg | ORAL_CAPSULE | Freq: Every day | ORAL | Status: DC

## 2015-10-01 DIAGNOSIS — E119 Type 2 diabetes mellitus without complications: Secondary | ICD-10-CM | POA: Diagnosis not present

## 2015-10-01 DIAGNOSIS — R000 Tachycardia, unspecified: Secondary | ICD-10-CM | POA: Diagnosis not present

## 2015-10-01 DIAGNOSIS — Z7984 Long term (current) use of oral hypoglycemic drugs: Secondary | ICD-10-CM | POA: Diagnosis not present

## 2015-10-01 DIAGNOSIS — E785 Hyperlipidemia, unspecified: Secondary | ICD-10-CM | POA: Diagnosis not present

## 2015-10-01 DIAGNOSIS — M545 Low back pain: Secondary | ICD-10-CM | POA: Diagnosis not present

## 2015-12-24 DIAGNOSIS — H40013 Open angle with borderline findings, low risk, bilateral: Secondary | ICD-10-CM | POA: Diagnosis not present

## 2015-12-24 DIAGNOSIS — E119 Type 2 diabetes mellitus without complications: Secondary | ICD-10-CM | POA: Diagnosis not present

## 2016-01-30 ENCOUNTER — Other Ambulatory Visit: Payer: Self-pay | Admitting: Cardiology

## 2016-02-11 DIAGNOSIS — N5201 Erectile dysfunction due to arterial insufficiency: Secondary | ICD-10-CM | POA: Diagnosis not present

## 2016-02-11 DIAGNOSIS — N401 Enlarged prostate with lower urinary tract symptoms: Secondary | ICD-10-CM | POA: Diagnosis not present

## 2016-02-11 DIAGNOSIS — Z8546 Personal history of malignant neoplasm of prostate: Secondary | ICD-10-CM | POA: Diagnosis not present

## 2016-02-11 DIAGNOSIS — R350 Frequency of micturition: Secondary | ICD-10-CM | POA: Diagnosis not present

## 2016-02-25 ENCOUNTER — Other Ambulatory Visit: Payer: Self-pay | Admitting: Cardiology

## 2016-02-26 ENCOUNTER — Other Ambulatory Visit: Payer: Self-pay

## 2016-02-26 MED ORDER — SIMVASTATIN 10 MG PO TABS: 10.0000 mg | ORAL_TABLET | Freq: Every day | ORAL | 1 refills | Status: DC

## 2016-03-07 ENCOUNTER — Ambulatory Visit (INDEPENDENT_AMBULATORY_CARE_PROVIDER_SITE_OTHER): Payer: Medicare Other | Admitting: Cardiology

## 2016-03-07 ENCOUNTER — Encounter: Payer: Self-pay | Admitting: Cardiology

## 2016-03-07 VITALS — BP 136/72 | HR 82 | Ht 67.0 in | Wt 284.0 lb

## 2016-03-07 DIAGNOSIS — R000 Tachycardia, unspecified: Secondary | ICD-10-CM

## 2016-03-07 DIAGNOSIS — E785 Hyperlipidemia, unspecified: Secondary | ICD-10-CM

## 2016-03-07 NOTE — Patient Instructions (Signed)

## 2016-03-07 NOTE — Progress Notes (Signed)
[place]. [address] [place], [place]  [address] Phone: [phone] Fax:  [phone]  Date:  03/07/2016   ID:  Kevin Morgan, Kevin Morgan 01-05-1951, MRN [id number]  PCP:  Dorian Heckle, MD   History of Present Illness: Kevin Morgan is a 65 y.o. male hyperlipidemia, tachycardia, pulse was 110 at original clinic visit. Echocardiogram was reassuring with normal ejection fraction. Holter monitor (03/2009) showed average heart rate of 97 beats per minute. He states that since the diltiazem he is feeling better. He is not waking up in the middle of night with his heart pounding.  No chest pain, no syncope, no SOB. Decreased simvastatin to 10mg . 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 There is a pit bull.  He was with his brother in the hospital for 7 nights.  Doing well.   Wt Readings from Last 3 Encounters:  03/07/16 284 lb (128.8 kg)  02/27/15 281 lb 12.8 oz (127.8 kg)  09/23/14 298 lb (135.2 kg)     Past Medical History:  Diagnosis Date  . Anal fissure   . Anemia   . Colon polyps 2008/2011  . Depression   . Diabetes mellitus without complication ([place])   . ED (erectile dysfunction)   . Hearing loss   . Hyperlipidemia   . Hypertension   . Prostate cancer ([place]) 2010   seed implants  . Proteinuria   . Sleep apnea, obstructive    cpap used  . Tachycardia   . Urticaria     Past Surgical History:  Procedure Laterality Date  . APPENDECTOMY     mesh used- ruptured  appendix  . COLONOSCOPY WITH PROPOFOL N/A 09/23/2014   Procedure: COLONOSCOPY WITH PROPOFOL;  Surgeon: [name], MD;  Location: [facility];  Service: Endoscopy;  Laterality: N/A;  . INCISIONAL HERNIA REPAIR     s/p appendectomy-open    Current Outpatient Prescriptions  Medication Sig Dispense Refill  . acetaminophen (TYLENOL) 650 MG CR tablet Take 650 mg by mouth every 8 (eight) hours as needed for pain.    Marland Kitchen aspirin 81 MG tablet Take 81 mg by mouth  every morning.     Marland Kitchen aspirin-sod bicarb-citric acid (ALKA-SELTZER) 325 MG TBEF tablet Take 325 mg by mouth every 6 (six) hours as needed (indigestion.).     Marland Kitchen cyanocobalamin 1000 MCG tablet Take 100 mcg by mouth every morning.     . diltiazem (CARDIZEM CD) 240 MG 24 hr capsule TAKE ONE CAPSULE BY MOUTH EVERY DAY 90 capsule 0  . fexofenadine (ALLEGRA) 180 MG tablet Take 180 mg by mouth at bedtime.    Marland Kitchen glimepiride (AMARYL) 2 MG tablet Take 2 mg by mouth every evening.     . loratadine (CLARITIN) 10 MG tablet Take 10 mg by mouth every morning.     . metFORMIN (GLUCOPHAGE) 500 MG tablet Take 1,000 mg by mouth 2 (two) times daily.     Marland Kitchen omeprazole (PRILOSEC) 20 MG capsule Take 20 mg by mouth every morning.     . simvastatin (ZOCOR) 10 MG tablet Take 1 tablet (10 mg total) by mouth at bedtime. 30 tablet 1  . sodium chloride (OCEAN) 0.65 % SOLN nasal spray Place 1 spray into both nostrils as needed for congestion.    . tadalafil (CIALIS) 5 MG tablet Take 5 mg by mouth at bedtime.      No current facility-administered medications for this visit.  Allergies:    Allergies  Allergen Reactions  . Cinnamon Other (See Comments)    Lips and eye swelling.   Marland Kitchen Hydrocodone-Acetaminophen Swelling  . Naprosyn [Naproxen] Hives    Social History:  The patient  reports that he quit smoking about 47 years ago. His smoking use included Cigarettes. He does not have any smokeless tobacco history on file. He reports that he does not drink alcohol or use drugs.   Family History  Problem Relation Age of Onset  . Cancer - Colon Father   . Cancer - Colon Brother   . Cancer - Colon Maternal Uncle     ROS:  Please see the history of present illness.   Denies any strokelike symptoms, chest pain, orthopnea, PND   All other systems reviewed and negative.   PHYSICAL EXAM: VS:  BP 136/72   Pulse 82   Ht 5\' 7"  (1.702 m)   Wt 284 lb (128.8 kg)   BMI 44.48 kg/m    Well nourished, well developed, in no acute  distress  HEENT: normal, /AT, EOMI Neck: no JVD, normal carotid upstroke, no bruit Cardiac:  normal S1, S2; RRR; no murmur  Lungs:  clear to auscultation bilaterally, no wheezing, rhonchi or rales  Abd: soft, nontender, no hepatomegaly, no bruits obese Ext: no edema, 2+ distal pulses Skin: warm and dry  GU: deferred Neuro: no focal abnormalities noted, AAO x 3  EKG: Today 03/07/16-sinus rhythm, 84, no other abnormalities personally viewed-prior 02/27/15 - NSR, personally viewed 01/14/14-sinus rhythm, 96, nonspecific ST-T wave changes, no change from prior  Echocardiogram: 10/14-normal ejection fraction, structurally normal Holter monitor: 10/14-average heart rate 97 beats per minute. No adverse arrhythmias.  ASSESSMENT AND PLAN:  1. Tachycardia-originally in 110 beats per minute. Medications as above. No changes. Doing well with diltiazem. His heart rate currently is in the 80s. Not having any side effects from medications. 2. Morbid obesity-continue to encourage weight loss. Decrease carbohydrates. Discussed. 3. Hyperlipidemia- simvastatin to 10 mg because of difficulty tolerating medication dose. 105. He is overall doing well. 4. One year followup  Signed, Candee Furbish, MD [facility]  03/07/2016 10:51 AM

## 2016-05-03 ENCOUNTER — Other Ambulatory Visit: Payer: Self-pay | Admitting: Cardiology

## 2016-05-14 ENCOUNTER — Other Ambulatory Visit: Payer: Self-pay | Admitting: Cardiology

## 2016-05-31 DIAGNOSIS — Z23 Encounter for immunization: Secondary | ICD-10-CM | POA: Diagnosis not present

## 2016-05-31 DIAGNOSIS — Z7984 Long term (current) use of oral hypoglycemic drugs: Secondary | ICD-10-CM | POA: Diagnosis not present

## 2016-05-31 DIAGNOSIS — F329 Major depressive disorder, single episode, unspecified: Secondary | ICD-10-CM | POA: Diagnosis not present

## 2016-05-31 DIAGNOSIS — N529 Male erectile dysfunction, unspecified: Secondary | ICD-10-CM | POA: Diagnosis not present

## 2016-05-31 DIAGNOSIS — Z0000 Encounter for general adult medical examination without abnormal findings: Secondary | ICD-10-CM | POA: Diagnosis not present

## 2016-05-31 DIAGNOSIS — G4733 Obstructive sleep apnea (adult) (pediatric): Secondary | ICD-10-CM | POA: Diagnosis not present

## 2016-05-31 DIAGNOSIS — C61 Malignant neoplasm of prostate: Secondary | ICD-10-CM | POA: Diagnosis not present

## 2016-05-31 DIAGNOSIS — Z86010 Personal history of colonic polyps: Secondary | ICD-10-CM | POA: Diagnosis not present

## 2016-05-31 DIAGNOSIS — D179 Benign lipomatous neoplasm, unspecified: Secondary | ICD-10-CM | POA: Diagnosis not present

## 2016-05-31 DIAGNOSIS — E119 Type 2 diabetes mellitus without complications: Secondary | ICD-10-CM | POA: Diagnosis not present

## 2016-05-31 DIAGNOSIS — I10 Essential (primary) hypertension: Secondary | ICD-10-CM | POA: Diagnosis not present

## 2016-05-31 DIAGNOSIS — Z1389 Encounter for screening for other disorder: Secondary | ICD-10-CM | POA: Diagnosis not present

## 2016-05-31 DIAGNOSIS — K219 Gastro-esophageal reflux disease without esophagitis: Secondary | ICD-10-CM | POA: Diagnosis not present

## 2016-05-31 DIAGNOSIS — E785 Hyperlipidemia, unspecified: Secondary | ICD-10-CM | POA: Diagnosis not present

## 2016-09-01 DIAGNOSIS — H40013 Open angle with borderline findings, low risk, bilateral: Secondary | ICD-10-CM | POA: Diagnosis not present

## 2016-09-01 DIAGNOSIS — H2513 Age-related nuclear cataract, bilateral: Secondary | ICD-10-CM | POA: Diagnosis not present

## 2016-09-01 DIAGNOSIS — E119 Type 2 diabetes mellitus without complications: Secondary | ICD-10-CM | POA: Diagnosis not present

## 2017-01-26 DIAGNOSIS — E119 Type 2 diabetes mellitus without complications: Secondary | ICD-10-CM | POA: Diagnosis not present

## 2017-01-26 DIAGNOSIS — E1165 Type 2 diabetes mellitus with hyperglycemia: Secondary | ICD-10-CM | POA: Diagnosis not present

## 2017-01-26 DIAGNOSIS — Z7984 Long term (current) use of oral hypoglycemic drugs: Secondary | ICD-10-CM | POA: Diagnosis not present

## 2017-01-26 DIAGNOSIS — G4733 Obstructive sleep apnea (adult) (pediatric): Secondary | ICD-10-CM | POA: Diagnosis not present

## 2017-01-26 DIAGNOSIS — R000 Tachycardia, unspecified: Secondary | ICD-10-CM | POA: Diagnosis not present

## 2017-01-26 DIAGNOSIS — E7800 Pure hypercholesterolemia, unspecified: Secondary | ICD-10-CM | POA: Diagnosis not present

## 2017-03-02 DIAGNOSIS — H40013 Open angle with borderline findings, low risk, bilateral: Secondary | ICD-10-CM | POA: Diagnosis not present

## 2017-03-02 DIAGNOSIS — E119 Type 2 diabetes mellitus without complications: Secondary | ICD-10-CM | POA: Diagnosis not present

## 2017-03-02 DIAGNOSIS — H2513 Age-related nuclear cataract, bilateral: Secondary | ICD-10-CM | POA: Diagnosis not present

## 2017-03-15 DIAGNOSIS — Z23 Encounter for immunization: Secondary | ICD-10-CM | POA: Diagnosis not present

## 2017-04-06 ENCOUNTER — Ambulatory Visit: Payer: Medicare Other | Admitting: Cardiology

## 2017-05-15 DIAGNOSIS — Z8546 Personal history of malignant neoplasm of prostate: Secondary | ICD-10-CM | POA: Diagnosis not present

## 2017-05-15 DIAGNOSIS — N401 Enlarged prostate with lower urinary tract symptoms: Secondary | ICD-10-CM | POA: Diagnosis not present

## 2017-05-15 DIAGNOSIS — R3915 Urgency of urination: Secondary | ICD-10-CM | POA: Diagnosis not present

## 2017-06-21 ENCOUNTER — Ambulatory Visit: Payer: Medicare Other | Admitting: Cardiology

## 2017-07-21 ENCOUNTER — Ambulatory Visit: Payer: Medicare Other | Admitting: Cardiology

## 2017-07-26 ENCOUNTER — Ambulatory Visit (INDEPENDENT_AMBULATORY_CARE_PROVIDER_SITE_OTHER): Payer: Medicare Other | Admitting: Cardiology

## 2017-07-26 ENCOUNTER — Encounter: Payer: Self-pay | Admitting: Cardiology

## 2017-07-26 ENCOUNTER — Encounter (INDEPENDENT_AMBULATORY_CARE_PROVIDER_SITE_OTHER): Payer: Self-pay

## 2017-07-26 VITALS — BP 128/60 | HR 81 | Ht 69.0 in | Wt 289.6 lb

## 2017-07-26 DIAGNOSIS — R000 Tachycardia, unspecified: Secondary | ICD-10-CM | POA: Diagnosis not present

## 2017-07-26 DIAGNOSIS — R9431 Abnormal electrocardiogram [ECG] [EKG]: Secondary | ICD-10-CM

## 2017-07-26 DIAGNOSIS — E7800 Pure hypercholesterolemia, unspecified: Secondary | ICD-10-CM | POA: Diagnosis not present

## 2017-07-26 NOTE — Progress Notes (Signed)
[place]. [address], [address] [place], [place]  [address] Phone: [phone] Fax:  [phone]  Date:  07/26/2017   ID:  Kevin, Morgan Mar 09, 1951, MRN [id number]  PCP:  [name], MD   History of Present Illness: Kevin Morgan is a 67 y.o. male hyperlipidemia, tachycardia, pulse was 110 at original clinic visit. Echocardiogram was reassuring with normal ejection fraction. Holter monitor (03/2009) showed average heart rate of 97 beats per minute. He states that since the diltiazem he is feeling better. He is not waking up in the middle of night with his heart pounding.  No chest pain, no syncope, no SOB. Decreased simvastatin to 10mg . 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 There is a pit bull.  He was with his brother in the hospital for 7 nights.  07/26/17-overall he has been doing quite well.  No significant shortness of breath, chest pain, syncope, bleeding.  He has noticed some increased lower extremity swelling especially at night in his feet.  This seems to improve after waking up.  We discussed this as a possible side effect of diltiazem as well as weight.   Wt Readings from Last 3 Encounters:  07/26/17 289 lb 9.6 oz (131.4 kg)  03/07/16 284 lb (128.8 kg)  02/27/15 281 lb 12.8 oz (127.8 kg)     Past Medical History:  Diagnosis Date  . Anal fissure   . Anemia   . Colon polyps 2008/2011  . Depression   . Diabetes mellitus without complication ([place])   . ED (erectile dysfunction)   . Hearing loss   . Hyperlipidemia   . Hypertension   . Prostate cancer ([place]) 2010   seed implants  . Proteinuria   . Sleep apnea, obstructive    cpap used  . Tachycardia   . Urticaria     Past Surgical History:  Procedure Laterality Date  . APPENDECTOMY     mesh used- ruptured  appendix  . COLONOSCOPY WITH PROPOFOL N/A 09/23/2014   Procedure: COLONOSCOPY WITH PROPOFOL;  Surgeon: [name], MD;  Location: [facility];   Service: Endoscopy;  Laterality: N/A;  . INCISIONAL HERNIA REPAIR     s/p appendectomy-open    Current Outpatient Medications  Medication Sig Dispense Refill  . acetaminophen (TYLENOL) 650 MG CR tablet Take 650 mg by mouth every 8 (eight) hours as needed for pain.    Marland Kitchen aspirin 81 MG tablet Take 81 mg by mouth every morning.     Marland Kitchen aspirin-sod bicarb-citric acid (ALKA-SELTZER) 325 MG TBEF tablet Take 325 mg by mouth every 6 (six) hours as needed (indigestion.).     Marland Kitchen cyanocobalamin 1000 MCG tablet Take 100 mcg by mouth every morning.     . diltiazem (CARDIZEM CD) 240 MG 24 hr capsule TAKE ONE CAPSULE BY MOUTH EVERY DAY 90 capsule 3  . fexofenadine (ALLEGRA) 180 MG tablet Take 180 mg by mouth at bedtime.    Marland Kitchen glimepiride (AMARYL) 2 MG tablet Take 2 mg by mouth every evening.     . loratadine (CLARITIN) 10 MG tablet Take 10 mg by mouth every morning.     . metFORMIN (GLUCOPHAGE) 500 MG tablet Take 1,000 mg by mouth 2 (two) times daily.     Marland Kitchen omeprazole (PRILOSEC) 20 MG capsule Take 20 mg by mouth every morning.     . simvastatin (ZOCOR) 10 MG tablet TAKE 1 TABLET (10 MG TOTAL) BY MOUTH AT  BEDTIME. 30 tablet 9  . sodium chloride (OCEAN) 0.65 % SOLN nasal spray Place 1 spray into both nostrils as needed for congestion.    . tadalafil (CIALIS) 5 MG tablet Take 5 mg by mouth at bedtime.     . tamsulosin (FLOMAX) 0.4 MG CAPS capsule Take 0.4 mg by mouth every morning.  11   No current facility-administered medications for this visit.     Allergies:    Allergies  Allergen Reactions  . Cinnamon Other (See Comments)    Lips and eye swelling.   Marland Kitchen Hydrocodone-Acetaminophen Swelling  . Naprosyn [Naproxen] Hives    Social History:  The patient  reports that he quit smoking about 48 years ago. His smoking use included cigarettes. he has never used smokeless tobacco. He reports that he does not drink alcohol or use drugs.   Family History  Problem Relation Age of Onset  . Cancer - Colon Father     . Cancer - Colon Brother   . Cancer - Colon Maternal Uncle     ROS:  Please see the history of present illness.  All other ROS neg PHYSICAL EXAM: VS:  BP 128/60   Pulse 81   Ht 5\' 9"  (1.753 m)   Wt 289 lb 9.6 oz (131.4 kg)   BMI 42.77 kg/m    GEN: Well nourished, well developed, in no acute distress, obese HEENT: normal  Neck: no JVD, carotid bruits, or masses Cardiac: RRR; no murmurs, rubs, or gallops, 1+ BLE edema  Respiratory:  clear to auscultation bilaterally, normal work of breathing GI: soft, nontender, nondistended, + BS MS: no deformity or atrophy  Skin: warm and dry, no rash Neuro:  Alert and Oriented x 3, Strength and sensation are intact Psych: euthymic mood, full affect    EKG: Today 07/26/17 - NSR NSSTW changes. 03/07/16-sinus rhythm, 84, no other abnormalities personally viewed-prior 02/27/15 - NSR, personally viewed 01/14/14-sinus rhythm, 96, nonspecific ST-T wave changes, no change from prior  Echocardiogram: 10/14-normal ejection fraction, structurally normal Holter monitor: 10/14-average heart rate 97 beats per minute. No adverse arrhythmias.  ASSESSMENT AND PLAN:  1. Tachycardia/abnormal ECG (NSSTW changes)-originally in 110 beats per minute. Medications as above. No changes. Doing well with diltiazem. His heart rate currently is in the 80s.  He may be having some increased swelling in his lower extremities from diltiazem.  Consider compression socks.  Weight loss would also be helpful with this.  No changes right now on diltiazem.  Could consider metoprolol in the future if necessary.  Is been several years since we have checked an echocardiogram.  Given some of his increased swelling in his feet and nonspecific ST-T wave changes on ECG, we will check an echocardiogram. 2. Morbid obesity-continue to encourage weight loss. Decrease carbohydrates.  Discussed this once again. 3. Hyperlipidemia- simvastatin to 10 mg because of difficulty tolerating medication dose.   99. He is overall doing well. 4. One year followup  Signed, Candee Furbish, MD [facility]  07/26/2017 11:10 AM

## 2017-07-26 NOTE — Patient Instructions (Signed)
Medication Instructions:  Your physician recommends that you continue on your current medications as directed. Please refer to the Current Medication list given to you today.   Labwork: -None  Testing/Procedures: Your physician has requested that you have an echocardiogram. Echocardiography is a painless test that uses sound waves to create images of your heart. It provides your doctor with information about the size and shape of your heart and how well your heart's chambers and valves are working. This procedure takes approximately one hour. There are no restrictions for this procedure.    Follow-Up: Your physician wants you to follow-up in: 1 year with Dr. Skain's.  You will receive a reminder letter in the mail two months in advance. If you don't receive a letter, please call our office to schedule the follow-up appointment.   Any Other Special Instructions Will Be Listed Below (If Applicable).     If you need a refill on your cardiac medications before your next appointment, please call your pharmacy.   

## 2017-08-30 ENCOUNTER — Ambulatory Visit (HOSPITAL_COMMUNITY): Payer: Medicare Other | Attending: Cardiology

## 2017-08-30 ENCOUNTER — Other Ambulatory Visit: Payer: Self-pay

## 2017-08-30 DIAGNOSIS — E119 Type 2 diabetes mellitus without complications: Secondary | ICD-10-CM | POA: Insufficient documentation

## 2017-08-30 DIAGNOSIS — R9431 Abnormal electrocardiogram [ECG] [EKG]: Secondary | ICD-10-CM | POA: Diagnosis not present

## 2017-08-30 DIAGNOSIS — I313 Pericardial effusion (noninflammatory): Secondary | ICD-10-CM | POA: Insufficient documentation

## 2017-08-30 DIAGNOSIS — E785 Hyperlipidemia, unspecified: Secondary | ICD-10-CM | POA: Insufficient documentation

## 2017-08-30 DIAGNOSIS — G4733 Obstructive sleep apnea (adult) (pediatric): Secondary | ICD-10-CM | POA: Insufficient documentation

## 2017-08-30 DIAGNOSIS — I10 Essential (primary) hypertension: Secondary | ICD-10-CM | POA: Diagnosis not present

## 2017-08-30 DIAGNOSIS — R000 Tachycardia, unspecified: Secondary | ICD-10-CM | POA: Diagnosis not present

## 2017-08-31 DIAGNOSIS — H40013 Open angle with borderline findings, low risk, bilateral: Secondary | ICD-10-CM | POA: Diagnosis not present

## 2017-10-11 DIAGNOSIS — B351 Tinea unguium: Secondary | ICD-10-CM | POA: Diagnosis not present

## 2017-10-11 DIAGNOSIS — G4733 Obstructive sleep apnea (adult) (pediatric): Secondary | ICD-10-CM | POA: Diagnosis not present

## 2017-10-11 DIAGNOSIS — E785 Hyperlipidemia, unspecified: Secondary | ICD-10-CM | POA: Diagnosis not present

## 2017-10-11 DIAGNOSIS — I10 Essential (primary) hypertension: Secondary | ICD-10-CM | POA: Diagnosis not present

## 2017-10-11 DIAGNOSIS — E1169 Type 2 diabetes mellitus with other specified complication: Secondary | ICD-10-CM | POA: Diagnosis not present

## 2017-10-11 DIAGNOSIS — C61 Malignant neoplasm of prostate: Secondary | ICD-10-CM | POA: Diagnosis not present

## 2017-10-11 DIAGNOSIS — N529 Male erectile dysfunction, unspecified: Secondary | ICD-10-CM | POA: Diagnosis not present

## 2017-10-11 DIAGNOSIS — Z0000 Encounter for general adult medical examination without abnormal findings: Secondary | ICD-10-CM | POA: Diagnosis not present

## 2017-10-11 DIAGNOSIS — F329 Major depressive disorder, single episode, unspecified: Secondary | ICD-10-CM | POA: Diagnosis not present

## 2017-10-11 DIAGNOSIS — E119 Type 2 diabetes mellitus without complications: Secondary | ICD-10-CM | POA: Diagnosis not present

## 2017-10-11 DIAGNOSIS — Z1389 Encounter for screening for other disorder: Secondary | ICD-10-CM | POA: Diagnosis not present

## 2017-10-11 DIAGNOSIS — Z1159 Encounter for screening for other viral diseases: Secondary | ICD-10-CM | POA: Diagnosis not present

## 2017-11-07 DIAGNOSIS — B351 Tinea unguium: Secondary | ICD-10-CM | POA: Diagnosis not present

## 2017-11-07 DIAGNOSIS — M79675 Pain in left toe(s): Secondary | ICD-10-CM | POA: Diagnosis not present

## 2017-11-07 DIAGNOSIS — M79674 Pain in right toe(s): Secondary | ICD-10-CM | POA: Diagnosis not present

## 2017-11-07 DIAGNOSIS — E1140 Type 2 diabetes mellitus with diabetic neuropathy, unspecified: Secondary | ICD-10-CM | POA: Diagnosis not present

## 2018-01-16 DIAGNOSIS — E1140 Type 2 diabetes mellitus with diabetic neuropathy, unspecified: Secondary | ICD-10-CM | POA: Diagnosis not present

## 2018-01-16 DIAGNOSIS — B351 Tinea unguium: Secondary | ICD-10-CM | POA: Diagnosis not present

## 2018-01-16 DIAGNOSIS — I739 Peripheral vascular disease, unspecified: Secondary | ICD-10-CM | POA: Diagnosis not present

## 2018-03-28 DIAGNOSIS — Z23 Encounter for immunization: Secondary | ICD-10-CM | POA: Diagnosis not present

## 2018-04-05 DIAGNOSIS — B351 Tinea unguium: Secondary | ICD-10-CM | POA: Diagnosis not present

## 2018-04-05 DIAGNOSIS — I739 Peripheral vascular disease, unspecified: Secondary | ICD-10-CM | POA: Diagnosis not present

## 2018-04-05 DIAGNOSIS — E1140 Type 2 diabetes mellitus with diabetic neuropathy, unspecified: Secondary | ICD-10-CM | POA: Diagnosis not present

## 2018-04-18 DIAGNOSIS — I10 Essential (primary) hypertension: Secondary | ICD-10-CM | POA: Diagnosis not present

## 2018-04-18 DIAGNOSIS — E1122 Type 2 diabetes mellitus with diabetic chronic kidney disease: Secondary | ICD-10-CM | POA: Diagnosis not present

## 2018-04-18 DIAGNOSIS — R000 Tachycardia, unspecified: Secondary | ICD-10-CM | POA: Diagnosis not present

## 2018-04-18 DIAGNOSIS — N183 Chronic kidney disease, stage 3 (moderate): Secondary | ICD-10-CM | POA: Diagnosis not present

## 2018-04-18 DIAGNOSIS — F418 Other specified anxiety disorders: Secondary | ICD-10-CM | POA: Diagnosis not present

## 2018-04-18 DIAGNOSIS — N393 Stress incontinence (female) (male): Secondary | ICD-10-CM | POA: Diagnosis not present

## 2018-04-18 DIAGNOSIS — E1169 Type 2 diabetes mellitus with other specified complication: Secondary | ICD-10-CM | POA: Diagnosis not present

## 2018-05-21 DIAGNOSIS — E119 Type 2 diabetes mellitus without complications: Secondary | ICD-10-CM | POA: Diagnosis not present

## 2018-05-21 DIAGNOSIS — H40013 Open angle with borderline findings, low risk, bilateral: Secondary | ICD-10-CM | POA: Diagnosis not present

## 2018-05-21 DIAGNOSIS — H2513 Age-related nuclear cataract, bilateral: Secondary | ICD-10-CM | POA: Diagnosis not present

## 2018-05-30 DIAGNOSIS — Z8546 Personal history of malignant neoplasm of prostate: Secondary | ICD-10-CM | POA: Diagnosis not present

## 2018-05-30 DIAGNOSIS — N5201 Erectile dysfunction due to arterial insufficiency: Secondary | ICD-10-CM | POA: Diagnosis not present

## 2018-05-30 DIAGNOSIS — R3912 Poor urinary stream: Secondary | ICD-10-CM | POA: Diagnosis not present

## 2018-05-30 DIAGNOSIS — N401 Enlarged prostate with lower urinary tract symptoms: Secondary | ICD-10-CM | POA: Diagnosis not present

## 2018-06-21 DIAGNOSIS — I739 Peripheral vascular disease, unspecified: Secondary | ICD-10-CM | POA: Diagnosis not present

## 2018-06-21 DIAGNOSIS — B351 Tinea unguium: Secondary | ICD-10-CM | POA: Diagnosis not present

## 2018-06-21 DIAGNOSIS — E1140 Type 2 diabetes mellitus with diabetic neuropathy, unspecified: Secondary | ICD-10-CM | POA: Diagnosis not present

## 2018-06-21 DIAGNOSIS — L853 Xerosis cutis: Secondary | ICD-10-CM | POA: Diagnosis not present

## 2018-07-31 ENCOUNTER — Ambulatory Visit: Payer: Medicare Other | Admitting: Cardiology

## 2018-09-05 ENCOUNTER — Ambulatory Visit: Payer: Medicare Other | Admitting: Cardiology

## 2018-09-05 ENCOUNTER — Encounter

## 2018-10-17 ENCOUNTER — Ambulatory Visit: Payer: Medicare Other | Admitting: Cardiology

## 2018-10-17 ENCOUNTER — Telehealth: Payer: Self-pay | Admitting: *Deleted

## 2018-10-17 NOTE — Telephone Encounter (Signed)
YOUR CARDIOLOGY TEAM HAS ARRANGED FOR AN E-VISIT FOR YOUR APPOINTMENT - PLEASE REVIEW IMPORTANT INFORMATION BELOW SEVERAL DAYS PRIOR TO YOUR APPOINTMENT  Due to the recent COVID-19 pandemic, we are transitioning in-person office visits to tele-medicine visits in an effort to decrease unnecessary exposure to our patients, their families, and staff. These visits are billed to your insurance just like a normal visit is. We also encourage you to sign up for MyChart if you have not already done so. You will need a smartphone if possible. For patients that do not have this, we can still complete the visit using a regular telephone but do prefer a smartphone to enable video when possible. You may have a family member that lives with you that can help. If possible, we also ask that you have a blood pressure cuff and scale at home to measure your blood pressure, heart rate and weight prior to your scheduled appointment. Patients with clinical needs that need an in-person evaluation and testing will still be able to come to the office if absolutely necessary. If you have any questions, feel free to call our office.  YOUR PROVIDER WILL BE USING THE FOLLOWING PLATFORM TO COMPLETE YOUR VISIT: [email or web address]  . IF USING  [email or web address].ME - Process reviewed with patient who states understanding and gives verbal consent  2-3 [place] will receive a telephone call from one of our [place] team members - your caller ID may say "Unknown caller." If this is a video visit, we will walk you through how to get the video launched on your phone. We will remind you check your blood pressure, heart rate and weight prior to your scheduled appointment. If you have an Apple Watch or Kardia, please upload any pertinent ECG strips the day before or morning of your appointment to Mobile. Our staff will also make sure you have reviewed the consent and agree to move forward with your scheduled tele-health visit.   THE DAY OF  YOUR APPOINTMENT  Approximately 15 minutes prior to your scheduled appointment, you will receive a telephone call from one of [place] team - your caller ID may say "Unknown caller."  Our staff will confirm medications, vital signs for the day and any symptoms you may be experiencing. Please have this information available prior to the time of visit start. It may also be helpful for you to have a pad of paper and pen handy for any instructions given during your visit. They will also walk you through joining the smartphone meeting if this is a video visit.  CONSENT FOR TELE-HEALTH VISIT - PLEASE REVIEW  I hereby voluntarily request, consent and authorize [facility] and its employed or contracted physicians, physician assistants, nurse practitioners or other licensed health care professionals (the Practitioner), to provide me with telemedicine health care services (the "Services") as deemed necessary by the treating Practitioner. I acknowledge and consent to receive the Services by the Practitioner via telemedicine. I understand that the telemedicine visit will involve communicating with the Practitioner through live audiovisual communication technology and the disclosure of certain medical information by electronic transmission. I acknowledge that I have been given the opportunity to request an in-person assessment or other available alternative prior to the telemedicine visit and am voluntarily participating in the telemedicine visit.  I understand that I have the right to withhold or withdraw my consent to the use of telemedicine in the course of my care at any time, without affecting my right to future care or  treatment, and that the Practitioner or I may terminate the telemedicine visit at any time. I understand that I have the right to inspect all information obtained and/or recorded in the course of the telemedicine visit and may receive copies of available information for a reasonable fee.  I  understand that some of the potential risks of receiving the Services via telemedicine include:  Marland Kitchen Delay or interruption in medical evaluation due to technological equipment failure or disruption; . Information transmitted may not be sufficient (e.g. poor resolution of images) to allow for appropriate medical decision making by the Practitioner; and/or  . In rare instances, security protocols could fail, causing a breach of personal health information.  Furthermore, I acknowledge that it is my responsibility to provide information about my medical history, conditions and care that is complete and accurate to the best of my ability. I acknowledge that Practitioner's advice, recommendations, and/or decision may be based on factors not within their control, such as incomplete or inaccurate data provided by me or distortions of diagnostic images or specimens that may result from electronic transmissions. I understand that the practice of medicine is not an exact science and that Practitioner makes no warranties or guarantees regarding treatment outcomes. I acknowledge that I will receive a copy of this consent concurrently upon execution via email to the email address I last provided but may also request a printed copy by calling the office of [place].    I understand that my insurance will be billed for this visit.   I have read or had this consent read to me. . I understand the contents of this consent, which adequately explains the benefits and risks of the Services being provided via telemedicine.  . I have been provided ample opportunity to ask questions regarding this consent and the Services and have had my questions answered to my satisfaction. . I give my informed consent for the services to be provided through the use of telemedicine in my medical care  By participating in this telemedicine visit I agree to the above. Pt gives verbal consent.

## 2018-10-24 ENCOUNTER — Encounter: Payer: Self-pay | Admitting: Cardiology

## 2018-10-24 ENCOUNTER — Telehealth (INDEPENDENT_AMBULATORY_CARE_PROVIDER_SITE_OTHER): Payer: Medicare Other | Admitting: Cardiology

## 2018-10-24 ENCOUNTER — Other Ambulatory Visit: Payer: Self-pay

## 2018-10-24 ENCOUNTER — Ambulatory Visit: Payer: Medicare Other | Admitting: Cardiology

## 2018-10-24 VITALS — BP 114/57 | HR 86 | Temp 98.4°F | Ht 69.0 in | Wt 281.4 lb

## 2018-10-24 DIAGNOSIS — E1169 Type 2 diabetes mellitus with other specified complication: Secondary | ICD-10-CM | POA: Diagnosis not present

## 2018-10-24 DIAGNOSIS — R000 Tachycardia, unspecified: Secondary | ICD-10-CM | POA: Diagnosis not present

## 2018-10-24 DIAGNOSIS — M47816 Spondylosis without myelopathy or radiculopathy, lumbar region: Secondary | ICD-10-CM | POA: Diagnosis not present

## 2018-10-24 DIAGNOSIS — N393 Stress incontinence (female) (male): Secondary | ICD-10-CM | POA: Diagnosis not present

## 2018-10-24 DIAGNOSIS — I10 Essential (primary) hypertension: Secondary | ICD-10-CM | POA: Diagnosis not present

## 2018-10-24 DIAGNOSIS — C61 Malignant neoplasm of prostate: Secondary | ICD-10-CM | POA: Diagnosis not present

## 2018-10-24 DIAGNOSIS — F418 Other specified anxiety disorders: Secondary | ICD-10-CM | POA: Diagnosis not present

## 2018-10-24 DIAGNOSIS — Z86010 Personal history of colonic polyps: Secondary | ICD-10-CM | POA: Diagnosis not present

## 2018-10-24 DIAGNOSIS — Z0000 Encounter for general adult medical examination without abnormal findings: Secondary | ICD-10-CM | POA: Diagnosis not present

## 2018-10-24 DIAGNOSIS — E1122 Type 2 diabetes mellitus with diabetic chronic kidney disease: Secondary | ICD-10-CM | POA: Diagnosis not present

## 2018-10-24 DIAGNOSIS — N183 Chronic kidney disease, stage 3 (moderate): Secondary | ICD-10-CM | POA: Diagnosis not present

## 2018-10-24 DIAGNOSIS — E7800 Pure hypercholesterolemia, unspecified: Secondary | ICD-10-CM

## 2018-10-24 NOTE — Patient Instructions (Signed)
  Medication Instructions:  The current medical regimen is effective;  continue present plan and medications.  If you need a refill on your cardiac medications before your next appointment, please call your pharmacy.   Follow-Up: Follow up in 1 year with Dr. Skains.  You will receive a letter in the mail 2 months before you are due.  Please call us when you receive this letter to schedule your follow up appointment.  Thank you for choosing [facility]!!     

## 2018-10-24 NOTE — Progress Notes (Signed)
Virtual Visit via Video Note   This visit type was conducted due to national recommendations for restrictions regarding the COVID-19 Pandemic (e.g. social distancing) in an effort to limit this patient's exposure and mitigate transmission in our community.  Due to his co-morbid illnesses, this patient is at least at moderate risk for complications without adequate follow up.  This format is felt to be most appropriate for this patient at this time.  All issues noted in this document were discussed and addressed.  A limited physical exam was performed with this format.  Please refer to the patient's chart for his consent to telehealth for [facility].   Date:  10/24/2018   ID:  Usman, Millett 12/13/1950, MRN [id number]  Patient Location: Home Provider Location: Home  PCP:  [name], MD  Cardiologist:  Candee Furbish, MD  Electrophysiologist:  None   Evaluation Performed:  Follow-Up Visit  Chief Complaint: Tachycardia follow-up  History of Present Illness:    Cal BEHR CISLO is a 68 y.o. male with tachycardia. Doing well, no problems. No CP, no SOB. Ankles are better. Switched 240mg  med to early in AM 3 AM.   Sometimes having night sweats, past 2 months, no fevers, no weight loss. Just talked to PCP. Getting labs soon.   The patient does not have symptoms concerning for COVID-19 infection (fever, chills, cough, or new shortness of breath).    Past Medical History:  Diagnosis Date  . Anal fissure   . Anemia   . Colon polyps 2008/2011  . Depression   . Diabetes mellitus without complication ([place])   . ED (erectile dysfunction)   . Hearing loss   . Hyperlipidemia   . Hypertension   . Prostate cancer ([place]) 2010   seed implants  . Proteinuria   . Sleep apnea, obstructive    cpap used  . Tachycardia   . Urticaria    Past Surgical History:  Procedure Laterality Date  . APPENDECTOMY     mesh used- ruptured  appendix  . COLONOSCOPY WITH PROPOFOL N/A  09/23/2014   Procedure: COLONOSCOPY WITH PROPOFOL;  Surgeon: [name], MD;  Location: [facility];  Service: Endoscopy;  Laterality: N/A;  . INCISIONAL HERNIA REPAIR     s/p appendectomy-open     Current Meds  Medication Sig  . acetaminophen (TYLENOL) 650 MG CR tablet Take 650 mg by mouth every 8 (eight) hours as needed for pain.  Marland Kitchen aspirin 81 MG tablet Take 81 mg by mouth every morning.   Marland Kitchen aspirin-sod bicarb-citric acid (ALKA-SELTZER) 325 MG TBEF tablet Take 325 mg by mouth every 6 (six) hours as needed (indigestion.).   Marland Kitchen cyanocobalamin 1000 MCG tablet Take 100 mcg by mouth every morning.   . diltiazem (CARDIZEM CD) 240 MG 24 hr capsule TAKE ONE CAPSULE BY MOUTH EVERY DAY  . famotidine (PEPCID) 20 MG tablet Take 20 mg by mouth daily.  . fexofenadine (ALLEGRA) 180 MG tablet Take 180 mg by mouth at bedtime.  Marland Kitchen glimepiride (AMARYL) 2 MG tablet Take 2 mg by mouth every evening.   . metFORMIN (GLUCOPHAGE) 500 MG tablet Take 1,000 mg by mouth 2 (two) times daily.   Marland Kitchen omeprazole (PRILOSEC) 20 MG capsule Take 20 mg by mouth every morning.   . simvastatin (ZOCOR) 20 MG tablet Take 20 mg by mouth every evening.  . sodium chloride (OCEAN) 0.65 % SOLN nasal spray Place 1 spray into both nostrils as needed for congestion.  . tadalafil (CIALIS) 5  MG tablet Take 5 mg by mouth at bedtime.   . tamsulosin (FLOMAX) 0.4 MG CAPS capsule Take 0.4 mg by mouth every morning.     Allergies:   Cinnamon; Hydrocodone-acetaminophen; and Naprosyn [naproxen]   Social History   Tobacco Use  . Smoking status: Former Smoker    Types: Cigarettes    Last attempt to quit: 09/07/1968    Years since quitting: 50.1  . Smokeless tobacco: Never Used  Substance Use Topics  . Alcohol use: No  . Drug use: No     Family Hx: The patient's family history includes Cancer - Colon in his brother, father, and maternal uncle.  ROS:   Please see the history of present illness.     All other systems reviewed and are  negative.   Prior CV studies:   The following studies were reviewed today:  Echocardiogram 08/30/2017: - Left ventricle: The cavity size was normal. Wall thickness was   increased in a pattern of mild LVH. Systolic function was normal.   The estimated ejection fraction was in the range of 60% to 65%.   Prominent apical trabeculation. Wall motion was normal; there   were no regional wall motion abnormalities. Doppler parameters   are consistent with abnormal left ventricular relaxation (grade 1   diastolic dysfunction). - Aortic valve: There was no stenosis. - Mitral valve: Mildly calcified annulus. There was no significant   regurgitation. - Right ventricle: The cavity size was normal. - Pulmonary arteries: No complete TR doppler jet so unable to   estimate PA systolic pressure. - Inferior vena cava: The vessel was normal in size. The   respirophasic diameter changes were in the normal range (>= 50%),   consistent with normal central venous pressure. - Pericardium, extracardiac: Small pericardial effusion.  Impressions:  - Normal LV size with mild LV hypertrophy. EF 60-65%. Normal RV   size and systolic function. No significant valvular   abnormalities.  Labs/Other Tests and Data Reviewed:    EKG:  An ECG dated 07/26/2017 was personally reviewed today and demonstrated:  Sinus rhythm, mild LVH, no other abnormalities  Recent Labs: No results found for requested labs within last 8760 hours.   Recent Lipid Panel No results found for: CHOL, TRIG, HDL, CHOLHDL, LDLCALC, LDLDIRECT  Wt Readings from Last 3 Encounters:  10/24/18 281 lb 6.4 oz (127.6 kg)  07/26/17 289 lb 9.6 oz (131.4 kg)  03/07/16 284 lb (128.8 kg)     Objective:    Vital Signs:  BP (!) 114/57   Pulse 86   Temp 98.4 F (36.9 C)   Ht 5\' 9"  (1.753 m)   Wt 281 lb 6.4 oz (127.6 kg)   BMI 41.56 kg/m    VITAL SIGNS:  reviewed GEN:  no acute distress EYES:  sclerae anicteric, EOMI - Extraocular Movements  Intact RESPIRATORY:  normal respiratory effort, symmetric expansion SKIN:  no rash, lesions or ulcers. MUSCULOSKELETAL:  no obvious deformities. NEURO:  alert and oriented x 3, no obvious focal deficit PSYCH:  normal affect  ASSESSMENT & PLAN:    Abnormal EKG/sinus tachycardia - Previously had heartbeat of 110 bpm.  Diltiazem seem to help with heart rates down in the 80s. - Echocardiogram in 2019 reassuring with normal EF. -No changes made.  Continue with current plan.  Lower extremity edema - Likely more so from morbid obesity then calcium channel blocker.  Continue with conservative measures.  He change the timing of his taking of calcium channel blocker to the  early morning and this seemed to have helped.  Essential hypertension - Medications reviewed as above. Excellent .Wrist cuff at home.  Morbid obesity - Continue to encourage weight loss.  Night sweats - He is going to have the blood work done by his primary doctor, Dr. Fara Olden.  He denies any daytime fevers, no weight loss.  COVID-19 Education: The signs and symptoms of COVID-19 were discussed with the patient and how to seek care for testing (follow up with PCP or arrange E-visit).  The importance of social distancing was discussed today.  Time:   Today, I have spent 8 minutes with the patient with telehealth technology discussing the above problems.     Medication Adjustments/Labs and Tests Ordered: Current medicines are reviewed at length with the patient today.  Concerns regarding medicines are outlined above.   Tests Ordered: No orders of the defined types were placed in this encounter.   Medication Changes: No orders of the defined types were placed in this encounter.   Disposition:  Follow up in 1 year(s)  Signed, Candee Furbish, MD  10/24/2018 2:22 PM    [facility]

## 2018-10-26 DIAGNOSIS — E1169 Type 2 diabetes mellitus with other specified complication: Secondary | ICD-10-CM | POA: Diagnosis not present

## 2018-10-26 DIAGNOSIS — I10 Essential (primary) hypertension: Secondary | ICD-10-CM | POA: Diagnosis not present

## 2018-11-16 DIAGNOSIS — I739 Peripheral vascular disease, unspecified: Secondary | ICD-10-CM | POA: Diagnosis not present

## 2018-11-16 DIAGNOSIS — E1140 Type 2 diabetes mellitus with diabetic neuropathy, unspecified: Secondary | ICD-10-CM | POA: Diagnosis not present

## 2018-11-16 DIAGNOSIS — B351 Tinea unguium: Secondary | ICD-10-CM | POA: Diagnosis not present

## 2019-01-25 DIAGNOSIS — I739 Peripheral vascular disease, unspecified: Secondary | ICD-10-CM | POA: Diagnosis not present

## 2019-01-25 DIAGNOSIS — E1140 Type 2 diabetes mellitus with diabetic neuropathy, unspecified: Secondary | ICD-10-CM | POA: Diagnosis not present

## 2019-01-25 DIAGNOSIS — B351 Tinea unguium: Secondary | ICD-10-CM | POA: Diagnosis not present

## 2019-02-25 DIAGNOSIS — H40013 Open angle with borderline findings, low risk, bilateral: Secondary | ICD-10-CM | POA: Diagnosis not present

## 2019-04-04 DIAGNOSIS — Z23 Encounter for immunization: Secondary | ICD-10-CM | POA: Diagnosis not present

## 2019-04-04 DIAGNOSIS — H401131 Primary open-angle glaucoma, bilateral, mild stage: Secondary | ICD-10-CM | POA: Diagnosis not present

## 2019-05-01 DIAGNOSIS — E1122 Type 2 diabetes mellitus with diabetic chronic kidney disease: Secondary | ICD-10-CM | POA: Diagnosis not present

## 2019-05-01 DIAGNOSIS — Z7984 Long term (current) use of oral hypoglycemic drugs: Secondary | ICD-10-CM | POA: Diagnosis not present

## 2019-05-01 DIAGNOSIS — G4733 Obstructive sleep apnea (adult) (pediatric): Secondary | ICD-10-CM | POA: Diagnosis not present

## 2019-05-01 DIAGNOSIS — I10 Essential (primary) hypertension: Secondary | ICD-10-CM | POA: Diagnosis not present

## 2019-05-01 DIAGNOSIS — N183 Chronic kidney disease, stage 3 unspecified: Secondary | ICD-10-CM | POA: Diagnosis not present

## 2019-05-01 DIAGNOSIS — C61 Malignant neoplasm of prostate: Secondary | ICD-10-CM | POA: Diagnosis not present

## 2019-05-02 DIAGNOSIS — H401131 Primary open-angle glaucoma, bilateral, mild stage: Secondary | ICD-10-CM | POA: Diagnosis not present

## 2019-05-16 DIAGNOSIS — B351 Tinea unguium: Secondary | ICD-10-CM | POA: Diagnosis not present

## 2019-05-16 DIAGNOSIS — I739 Peripheral vascular disease, unspecified: Secondary | ICD-10-CM | POA: Diagnosis not present

## 2019-05-16 DIAGNOSIS — E1140 Type 2 diabetes mellitus with diabetic neuropathy, unspecified: Secondary | ICD-10-CM | POA: Diagnosis not present

## 2019-08-15 DIAGNOSIS — I739 Peripheral vascular disease, unspecified: Secondary | ICD-10-CM | POA: Diagnosis not present

## 2019-08-15 DIAGNOSIS — B351 Tinea unguium: Secondary | ICD-10-CM | POA: Diagnosis not present

## 2019-08-15 DIAGNOSIS — B353 Tinea pedis: Secondary | ICD-10-CM | POA: Diagnosis not present

## 2019-08-15 DIAGNOSIS — E1140 Type 2 diabetes mellitus with diabetic neuropathy, unspecified: Secondary | ICD-10-CM | POA: Diagnosis not present

## 2019-08-25 DIAGNOSIS — Z23 Encounter for immunization: Secondary | ICD-10-CM | POA: Diagnosis not present

## 2019-08-29 DIAGNOSIS — G4733 Obstructive sleep apnea (adult) (pediatric): Secondary | ICD-10-CM | POA: Diagnosis not present

## 2019-09-05 DIAGNOSIS — E1140 Type 2 diabetes mellitus with diabetic neuropathy, unspecified: Secondary | ICD-10-CM | POA: Diagnosis not present

## 2019-09-05 DIAGNOSIS — I739 Peripheral vascular disease, unspecified: Secondary | ICD-10-CM | POA: Diagnosis not present

## 2019-09-09 DIAGNOSIS — H401131 Primary open-angle glaucoma, bilateral, mild stage: Secondary | ICD-10-CM | POA: Diagnosis not present

## 2019-10-15 ENCOUNTER — Ambulatory Visit: Payer: Medicare Other | Admitting: Cardiology

## 2019-10-22 ENCOUNTER — Ambulatory Visit (INDEPENDENT_AMBULATORY_CARE_PROVIDER_SITE_OTHER): Payer: Medicare Other | Admitting: Cardiology

## 2019-10-22 ENCOUNTER — Other Ambulatory Visit: Payer: Self-pay

## 2019-10-22 ENCOUNTER — Encounter: Payer: Self-pay | Admitting: Cardiology

## 2019-10-22 VITALS — BP 148/80 | HR 99 | Ht 69.0 in | Wt 284.0 lb

## 2019-10-22 DIAGNOSIS — E7800 Pure hypercholesterolemia, unspecified: Secondary | ICD-10-CM

## 2019-10-22 DIAGNOSIS — R000 Tachycardia, unspecified: Secondary | ICD-10-CM

## 2019-10-22 NOTE — Progress Notes (Signed)
Cardiology Office Note:    Date:  10/22/2019   ID:  Kevin Morgan, Kevin Morgan 14-Oct-1950, MRN [id number]  PCP:  Kevin Cha, MD  Cardiologist:  Kevin Furbish, MD  Electrophysiologist:  None   Referring MD: Kevin Morgan,*     History of Present Illness:    Kevin Morgan is a 69 y.o. male here for the follow-up of tachycardia.  Last visit was a telemedicine visit doing well.  Ankle edema had improved.  Switch to the diltiazem 240 mg too early in the morning.  Past Medical History:  Diagnosis Date  . Anal fissure   . Anemia   . Colon polyps 2008/2011  . Depression   . Diabetes mellitus without complication ([place])   . ED (erectile dysfunction)   . Hearing loss   . Hyperlipidemia   . Hypertension   . Prostate cancer ([place]) 2010   seed implants  . Proteinuria   . Sleep apnea, obstructive    cpap used  . Tachycardia   . Urticaria     Past Surgical History:  Procedure Laterality Date  . APPENDECTOMY     mesh used- ruptured  appendix  . COLONOSCOPY WITH PROPOFOL N/A 09/23/2014   Procedure: COLONOSCOPY WITH PROPOFOL;  Surgeon: [name], MD;  Location: [facility];  Service: Endoscopy;  Laterality: N/A;  . INCISIONAL HERNIA REPAIR     s/p appendectomy-open    Current Medications: Current Meds  Medication Sig  . acetaminophen (TYLENOL) 650 MG CR tablet Take 650 mg by mouth every 8 (eight) hours as needed for pain.  Marland Kitchen aspirin 81 MG tablet Take 81 mg by mouth every morning.   Marland Kitchen aspirin-sod bicarb-citric acid (ALKA-SELTZER) 325 MG TBEF tablet Take 325 mg by mouth every 6 (six) hours as needed (indigestion.).   Marland Kitchen cyanocobalamin 1000 MCG tablet Take 100 mcg by mouth every morning.   . diltiazem (CARDIZEM CD) 240 MG 24 hr capsule TAKE ONE CAPSULE BY MOUTH EVERY DAY  . famotidine (PEPCID) 20 MG tablet Take 20 mg by mouth daily.  . fexofenadine (ALLEGRA) 180 MG tablet Take 180 mg by mouth at bedtime.  Marland Kitchen glimepiride (AMARYL) 2 MG tablet Take 2 mg by mouth every  evening.   . metFORMIN (GLUCOPHAGE) 500 MG tablet Take 1,000 mg by mouth 2 (two) times daily.   Marland Kitchen omeprazole (PRILOSEC) 20 MG capsule Take 20 mg by mouth every morning.   . simvastatin (ZOCOR) 20 MG tablet Take 20 mg by mouth every evening.  . sodium chloride (OCEAN) 0.65 % SOLN nasal spray Place 1 spray into both nostrils as needed for congestion.  . tadalafil (CIALIS) 5 MG tablet Take 5 mg by mouth at bedtime.   . tamsulosin (FLOMAX) 0.4 MG CAPS capsule Take 0.4 mg by mouth every morning.     Allergies:   Cinnamon, Hydrocodone-acetaminophen, and Naprosyn [naproxen]   Social History   Socioeconomic History  . Marital status: Divorced    Spouse name: Not on file  . Number of children: Not on file  . Years of education: Not on file  . Highest education level: Not on file  Occupational History  . Not on file  Tobacco Use  . Smoking status: Former Smoker    Types: Cigarettes    Quit date: 09/07/1968    Years since quitting: 51.1  . Smokeless tobacco: Never Used  Substance and Sexual Activity  . Alcohol use: No  . Drug use: No  . Sexual activity: Not on file  Other Topics  Concern  . Not on file  Social History Narrative  . Not on file   Social Determinants of Health   Financial Resource Strain:   . Difficulty of Paying Living Expenses:   Food Insecurity:   . Worried About Charity fundraiser in the Last Year:   . Arboriculturist in the Last Year:   Transportation Needs:   . Film/video editor (Medical):   Marland Kitchen Lack of Transportation (Non-Medical):   Physical Activity:   . Days of Exercise per Week:   . Minutes of Exercise per Session:   Stress:   . Feeling of Stress :   Social Connections:   . Frequency of Communication with Friends and Family:   . Frequency of Social Gatherings with Friends and Family:   . Attends Religious Services:   . Active Member of Clubs or Organizations:   . Attends Archivist Meetings:   Marland Kitchen Marital Status:      Family  History: The patient's family history includes Cancer - Colon in his brother, father, and maternal uncle.  ROS:   Please see the history of present illness.    Sometimes may feel little bit dizziness or disequilibrium when changing position all other systems reviewed and are negative.  EKGs/Labs/Other Studies Reviewed:    The following studies were reviewed today:  Echocardiogram 08/30/2017: - Left ventricle: The cavity size was normal. Wall thickness was increased in a pattern of mild LVH. Systolic function was normal. The estimated ejection fraction was in the range of 60% to 65%. Prominent apical trabeculation. Wall motion was normal; there were no regional wall motion abnormalities. Doppler parameters are consistent with abnormal left ventricular relaxation (grade 1 diastolic dysfunction). - Aortic valve: There was no stenosis. - Mitral valve: Mildly calcified annulus. There was no significant regurgitation. - Right ventricle: The cavity size was normal. - Pulmonary arteries: No complete TR doppler jet so unable to estimate PA systolic pressure. - Inferior vena cava: The vessel was normal in size. The respirophasic diameter changes were in the normal range (>= 50%), consistent with normal central venous pressure. - Pericardium, extracardiac: Small pericardial effusion.   EKG:  EKG is ordered today.  The ekg ordered today demonstrates sinus rhythm left axis deviation 99 bpm  Recent Labs: No results found for requested labs within last 8760 hours.  Recent Lipid Panel No results found for: CHOL, TRIG, HDL, CHOLHDL, VLDL, LDLCALC, LDLDIRECT  Physical Exam:    VS:  BP (!) 148/80   Pulse 99   Ht 5\' 9"  (1.753 m)   Wt 284 lb (128.8 kg)   SpO2 98%   BMI 41.94 kg/m     Wt Readings from Last 3 Encounters:  10/22/19 284 lb (128.8 kg)  10/24/18 281 lb 6.4 oz (127.6 kg)  07/26/17 289 lb 9.6 oz (131.4 kg)     GEN:  Well nourished, well developed in no  acute distress, overweight HEENT: Normal NECK: No JVD; No carotid bruits LYMPHATICS: No lymphadenopathy CARDIAC: RRR, no murmurs, rubs, gallops RESPIRATORY:  Clear to auscultation without rales, wheezing or rhonchi  ABDOMEN: Soft, non-tender, non-distended MUSCULOSKELETAL: Minimal lower extremity edema; No deformity  SKIN: Warm and dry NEUROLOGIC:  Alert and oriented x 3 PSYCHIATRIC:  Normal affect   ASSESSMENT:    1. Tachycardia   2. Pure hypercholesterolemia   3. Morbid obesity due to excess calories ([place])    PLAN:    In order of problems listed above:  Sinus tachycardia/abnormal EKG -Previously  heart rates were in the 110 range and the diltiazem seem to help control this reducing the rates into the 80s.  His echocardiogram showed normal EF in 2019.  No evidence of tachycardia mediated cardiomyopathy.  Morbid obesity -Continue to encourage weight loss  Lower extremity edema -Calcium channel blocker could be playing a role in this.  Continue with conservative measures at this time.  Taking the medication earlier in the morning seem to help.  Diabetes with essential hypertension -Takes blood pressure with a wrist cuff at home.  Had Covid vaccine and blood pressure did spike transiently.   Medication Adjustments/Labs and Tests Ordered: Current medicines are reviewed at length with the patient today.  Concerns regarding medicines are outlined above.  Orders Placed This Encounter  Procedures  . EKG 12-Lead   No orders of the defined types were placed in this encounter.   Patient Instructions  Medication Instructions:  The current medical regimen is effective;  continue present plan and medications.  *If you need a refill on your cardiac medications before your next appointment, please call your pharmacy*  Follow-Up: At [facility], you and your health needs are our priority.  As part of our continuing mission to provide you with exceptional heart care, we have  created designated Provider Care Teams.  These Care Teams include your primary Cardiologist (physician) and Advanced Practice Providers (APPs -  Physician Assistants and Nurse Practitioners) who all work together to provide you with the care you need, when you need it.  We recommend signing up for the patient portal called "MyChart".  Sign up information is provided on this After Visit Summary.  MyChart is used to connect with patients for Virtual Visits (Telemedicine).  Patients are able to view lab/test results, encounter notes, upcoming appointments, etc.  Non-urgent messages can be sent to your provider as well.   To learn more about what you can do with MyChart, go to [email or web address].    Your next appointment:   12 month(s)  The format for your next appointment:   In Person  Provider:   Candee Furbish, MD  Thank you for choosing [facility]!!        Signed, Kevin Furbish, MD  10/22/2019 10:33 AM    [place]

## 2019-10-22 NOTE — Patient Instructions (Signed)
Medication Instructions:  The current medical regimen is effective;  continue present plan and medications.  *If you need a refill on your cardiac medications before your next appointment, please call your pharmacy*  Follow-Up: At [facility], you and your health needs are our priority.  As part of our continuing mission to provide you with exceptional heart care, we have created designated Provider Care Teams.  These Care Teams include your primary Cardiologist (physician) and Advanced Practice Providers (APPs -  Physician Assistants and Nurse Practitioners) who all work together to provide you with the care you need, when you need it.  We recommend signing up for the patient portal called "MyChart".  Sign up information is provided on this After Visit Summary.  MyChart is used to connect with patients for Virtual Visits (Telemedicine).  Patients are able to view lab/test results, encounter notes, upcoming appointments, etc.  Non-urgent messages can be sent to your provider as well.   To learn more about what you can do with MyChart, go to [email or web address].    Your next appointment:   12 month(s)  The format for your next appointment:   In Person  Provider:   Mark Skains, MD   Thank you for choosing [facility]!!      

## 2019-10-24 DIAGNOSIS — C61 Malignant neoplasm of prostate: Secondary | ICD-10-CM | POA: Diagnosis not present

## 2019-10-24 DIAGNOSIS — N183 Chronic kidney disease, stage 3 unspecified: Secondary | ICD-10-CM | POA: Diagnosis not present

## 2019-10-24 DIAGNOSIS — G4733 Obstructive sleep apnea (adult) (pediatric): Secondary | ICD-10-CM | POA: Diagnosis not present

## 2019-10-24 DIAGNOSIS — I10 Essential (primary) hypertension: Secondary | ICD-10-CM | POA: Diagnosis not present

## 2019-10-24 DIAGNOSIS — E1122 Type 2 diabetes mellitus with diabetic chronic kidney disease: Secondary | ICD-10-CM | POA: Diagnosis not present

## 2019-10-24 DIAGNOSIS — Z0000 Encounter for general adult medical examination without abnormal findings: Secondary | ICD-10-CM | POA: Diagnosis not present

## 2019-10-24 DIAGNOSIS — Z1389 Encounter for screening for other disorder: Secondary | ICD-10-CM | POA: Diagnosis not present

## 2019-10-25 DIAGNOSIS — I739 Peripheral vascular disease, unspecified: Secondary | ICD-10-CM | POA: Diagnosis not present

## 2019-10-25 DIAGNOSIS — E1140 Type 2 diabetes mellitus with diabetic neuropathy, unspecified: Secondary | ICD-10-CM | POA: Diagnosis not present

## 2019-10-25 DIAGNOSIS — B351 Tinea unguium: Secondary | ICD-10-CM | POA: Diagnosis not present

## 2020-01-24 DIAGNOSIS — L853 Xerosis cutis: Secondary | ICD-10-CM | POA: Diagnosis not present

## 2020-01-24 DIAGNOSIS — I739 Peripheral vascular disease, unspecified: Secondary | ICD-10-CM | POA: Diagnosis not present

## 2020-01-24 DIAGNOSIS — E1140 Type 2 diabetes mellitus with diabetic neuropathy, unspecified: Secondary | ICD-10-CM | POA: Diagnosis not present

## 2020-03-02 DIAGNOSIS — H401131 Primary open-angle glaucoma, bilateral, mild stage: Secondary | ICD-10-CM | POA: Diagnosis not present

## 2020-04-28 DIAGNOSIS — L853 Xerosis cutis: Secondary | ICD-10-CM | POA: Diagnosis not present

## 2020-04-28 DIAGNOSIS — E1140 Type 2 diabetes mellitus with diabetic neuropathy, unspecified: Secondary | ICD-10-CM | POA: Diagnosis not present

## 2020-04-28 DIAGNOSIS — I739 Peripheral vascular disease, unspecified: Secondary | ICD-10-CM | POA: Diagnosis not present

## 2020-05-27 DIAGNOSIS — I10 Essential (primary) hypertension: Secondary | ICD-10-CM | POA: Diagnosis not present

## 2020-05-27 DIAGNOSIS — Z7984 Long term (current) use of oral hypoglycemic drugs: Secondary | ICD-10-CM | POA: Diagnosis not present

## 2020-05-27 DIAGNOSIS — Z23 Encounter for immunization: Secondary | ICD-10-CM | POA: Diagnosis not present

## 2020-05-27 DIAGNOSIS — E1122 Type 2 diabetes mellitus with diabetic chronic kidney disease: Secondary | ICD-10-CM | POA: Diagnosis not present

## 2020-05-27 DIAGNOSIS — N183 Chronic kidney disease, stage 3 unspecified: Secondary | ICD-10-CM | POA: Diagnosis not present

## 2020-05-28 DIAGNOSIS — H401131 Primary open-angle glaucoma, bilateral, mild stage: Secondary | ICD-10-CM | POA: Diagnosis not present

## 2020-07-31 DIAGNOSIS — E1140 Type 2 diabetes mellitus with diabetic neuropathy, unspecified: Secondary | ICD-10-CM | POA: Diagnosis not present

## 2020-07-31 DIAGNOSIS — L853 Xerosis cutis: Secondary | ICD-10-CM | POA: Diagnosis not present

## 2020-07-31 DIAGNOSIS — I739 Peripheral vascular disease, unspecified: Secondary | ICD-10-CM | POA: Diagnosis not present

## 2020-09-02 DIAGNOSIS — G4733 Obstructive sleep apnea (adult) (pediatric): Secondary | ICD-10-CM | POA: Diagnosis not present

## 2020-09-10 DIAGNOSIS — H401131 Primary open-angle glaucoma, bilateral, mild stage: Secondary | ICD-10-CM | POA: Diagnosis not present

## 2020-10-09 DIAGNOSIS — L853 Xerosis cutis: Secondary | ICD-10-CM | POA: Diagnosis not present

## 2020-10-09 DIAGNOSIS — I739 Peripheral vascular disease, unspecified: Secondary | ICD-10-CM | POA: Diagnosis not present

## 2020-10-09 DIAGNOSIS — E1140 Type 2 diabetes mellitus with diabetic neuropathy, unspecified: Secondary | ICD-10-CM | POA: Diagnosis not present

## 2020-12-07 DIAGNOSIS — C61 Malignant neoplasm of prostate: Secondary | ICD-10-CM | POA: Diagnosis not present

## 2020-12-07 DIAGNOSIS — M47816 Spondylosis without myelopathy or radiculopathy, lumbar region: Secondary | ICD-10-CM | POA: Diagnosis not present

## 2020-12-07 DIAGNOSIS — E1122 Type 2 diabetes mellitus with diabetic chronic kidney disease: Secondary | ICD-10-CM | POA: Diagnosis not present

## 2020-12-07 DIAGNOSIS — Z7984 Long term (current) use of oral hypoglycemic drugs: Secondary | ICD-10-CM | POA: Diagnosis not present

## 2020-12-07 DIAGNOSIS — R3911 Hesitancy of micturition: Secondary | ICD-10-CM | POA: Diagnosis not present

## 2020-12-07 DIAGNOSIS — Z1389 Encounter for screening for other disorder: Secondary | ICD-10-CM | POA: Diagnosis not present

## 2020-12-07 DIAGNOSIS — D179 Benign lipomatous neoplasm, unspecified: Secondary | ICD-10-CM | POA: Diagnosis not present

## 2020-12-07 DIAGNOSIS — G4733 Obstructive sleep apnea (adult) (pediatric): Secondary | ICD-10-CM | POA: Diagnosis not present

## 2020-12-07 DIAGNOSIS — Z86010 Personal history of colonic polyps: Secondary | ICD-10-CM | POA: Diagnosis not present

## 2020-12-07 DIAGNOSIS — K219 Gastro-esophageal reflux disease without esophagitis: Secondary | ICD-10-CM | POA: Diagnosis not present

## 2020-12-07 DIAGNOSIS — Z0000 Encounter for general adult medical examination without abnormal findings: Secondary | ICD-10-CM | POA: Diagnosis not present

## 2020-12-07 DIAGNOSIS — Z7189 Other specified counseling: Secondary | ICD-10-CM | POA: Diagnosis not present

## 2020-12-07 DIAGNOSIS — E785 Hyperlipidemia, unspecified: Secondary | ICD-10-CM | POA: Diagnosis not present

## 2020-12-07 DIAGNOSIS — Z23 Encounter for immunization: Secondary | ICD-10-CM | POA: Diagnosis not present

## 2020-12-07 DIAGNOSIS — I10 Essential (primary) hypertension: Secondary | ICD-10-CM | POA: Diagnosis not present

## 2020-12-18 DIAGNOSIS — I739 Peripheral vascular disease, unspecified: Secondary | ICD-10-CM | POA: Diagnosis not present

## 2020-12-18 DIAGNOSIS — E1140 Type 2 diabetes mellitus with diabetic neuropathy, unspecified: Secondary | ICD-10-CM | POA: Diagnosis not present

## 2020-12-18 DIAGNOSIS — L853 Xerosis cutis: Secondary | ICD-10-CM | POA: Diagnosis not present

## 2021-01-18 DIAGNOSIS — R3121 Asymptomatic microscopic hematuria: Secondary | ICD-10-CM | POA: Diagnosis not present

## 2021-01-18 DIAGNOSIS — R350 Frequency of micturition: Secondary | ICD-10-CM | POA: Diagnosis not present

## 2021-01-18 DIAGNOSIS — Z8546 Personal history of malignant neoplasm of prostate: Secondary | ICD-10-CM | POA: Diagnosis not present

## 2021-01-18 DIAGNOSIS — N401 Enlarged prostate with lower urinary tract symptoms: Secondary | ICD-10-CM | POA: Diagnosis not present

## 2021-02-26 DIAGNOSIS — L853 Xerosis cutis: Secondary | ICD-10-CM | POA: Diagnosis not present

## 2021-02-26 DIAGNOSIS — I739 Peripheral vascular disease, unspecified: Secondary | ICD-10-CM | POA: Diagnosis not present

## 2021-02-26 DIAGNOSIS — E1140 Type 2 diabetes mellitus with diabetic neuropathy, unspecified: Secondary | ICD-10-CM | POA: Diagnosis not present

## 2021-03-03 DIAGNOSIS — N35011 Post-traumatic bulbous urethral stricture: Secondary | ICD-10-CM | POA: Diagnosis not present

## 2021-03-03 DIAGNOSIS — Z8546 Personal history of malignant neoplasm of prostate: Secondary | ICD-10-CM | POA: Diagnosis not present

## 2021-03-03 DIAGNOSIS — N401 Enlarged prostate with lower urinary tract symptoms: Secondary | ICD-10-CM | POA: Diagnosis not present

## 2021-03-03 DIAGNOSIS — R3121 Asymptomatic microscopic hematuria: Secondary | ICD-10-CM | POA: Diagnosis not present

## 2021-03-08 DIAGNOSIS — N189 Chronic kidney disease, unspecified: Secondary | ICD-10-CM | POA: Diagnosis not present

## 2021-03-08 DIAGNOSIS — Z23 Encounter for immunization: Secondary | ICD-10-CM | POA: Diagnosis not present

## 2021-03-08 DIAGNOSIS — D631 Anemia in chronic kidney disease: Secondary | ICD-10-CM | POA: Diagnosis not present

## 2021-03-08 DIAGNOSIS — R809 Proteinuria, unspecified: Secondary | ICD-10-CM | POA: Diagnosis not present

## 2021-03-08 DIAGNOSIS — N1832 Chronic kidney disease, stage 3b: Secondary | ICD-10-CM | POA: Diagnosis not present

## 2021-03-08 DIAGNOSIS — N390 Urinary tract infection, site not specified: Secondary | ICD-10-CM | POA: Diagnosis not present

## 2021-03-08 DIAGNOSIS — R319 Hematuria, unspecified: Secondary | ICD-10-CM | POA: Diagnosis not present

## 2021-03-08 DIAGNOSIS — I129 Hypertensive chronic kidney disease with stage 1 through stage 4 chronic kidney disease, or unspecified chronic kidney disease: Secondary | ICD-10-CM | POA: Diagnosis not present

## 2021-03-08 DIAGNOSIS — N2581 Secondary hyperparathyroidism of renal origin: Secondary | ICD-10-CM | POA: Diagnosis not present

## 2021-03-08 DIAGNOSIS — R300 Dysuria: Secondary | ICD-10-CM | POA: Diagnosis not present

## 2021-03-09 ENCOUNTER — Other Ambulatory Visit: Payer: Self-pay | Admitting: Urology

## 2021-03-11 DIAGNOSIS — H401131 Primary open-angle glaucoma, bilateral, mild stage: Secondary | ICD-10-CM | POA: Diagnosis not present

## 2021-03-24 DIAGNOSIS — N1832 Chronic kidney disease, stage 3b: Secondary | ICD-10-CM | POA: Diagnosis not present

## 2021-03-29 ENCOUNTER — Encounter (HOSPITAL_BASED_OUTPATIENT_CLINIC_OR_DEPARTMENT_OTHER): Payer: Self-pay | Admitting: Urology

## 2021-04-01 ENCOUNTER — Encounter (HOSPITAL_BASED_OUTPATIENT_CLINIC_OR_DEPARTMENT_OTHER): Payer: Self-pay | Admitting: Urology

## 2021-04-01 ENCOUNTER — Other Ambulatory Visit: Payer: Self-pay

## 2021-04-01 DIAGNOSIS — T8859XA Other complications of anesthesia, initial encounter: Secondary | ICD-10-CM

## 2021-04-01 HISTORY — DX: Other complications of anesthesia, initial encounter: T88.59XA

## 2021-04-01 NOTE — Progress Notes (Addendum)
Spoke w/ via phone for pre-op interview---self Lab needs dos---- ISTAT, EKG              Lab results------11/01/2019 EKG in chart COVID test -----patient states asymptomatic no test needed Arrive at -------0700 on 04/06/21 NPO after MN NO Solid Food.  Clear liquids from MN until---0600 Med rec completed Medications to take morning of surgery -----none Diabetic medication -----Hold Metformin morning of surgery Patient instructed no nail polish to be worn day of surgery Patient instructed to bring photo id and insurance card day of surgery Patient aware to have Driver (ride ) / caregiver    for 24 hours after surgery - niece Rollen Sox, nephew [facility] Patient Special Instructions -----Bring CPAP. Pre-Op special Istructions -----none Patient verbalized understanding of instructions that were given at this phone interview. Patient denies shortness of breath, chest pain, fever, cough at this phone interview.     Patient states that with general anesthesia he has difficulty waking up and experiences SOB. Patient denies any recent chest pain or SOB. 08/30/2017 Echo showed estimated EF 60 - 65%. Last cardiology OV was 10/22/2019. He was instructed to f/u in 12 months. Patient has not followed up, but has an appointment on 04/09/21.Patient states that he prefers "lighter" anesthesia. Spoke with Dr. Royce Macadamia, MDA. He stated it was ok to proceed with surgery.

## 2021-04-05 NOTE — Anesthesia Preprocedure Evaluation (Addendum)
Anesthesia Evaluation  Patient identified by MRN, date of birth, ID band Patient awake    Reviewed: Allergy & Precautions, NPO status , Patient's Chart, lab work & pertinent test results  History of Anesthesia Complications Negative for: history of anesthetic complications  Airway Mallampati: II  TM Distance: >3 FB Neck ROM: Full    Dental  (+) Missing,    Pulmonary sleep apnea and Continuous Positive Airway Pressure Ventilation , former smoker,    Pulmonary exam normal        Cardiovascular hypertension, Pt. on medications Normal cardiovascular exam     Neuro/Psych Depression negative neurological ROS     GI/Hepatic Neg liver ROS, GERD  Controlled and Medicated,  Endo/Other  diabetes, Type 2, Oral Hypoglycemic AgentsMorbid obesity  Renal/GU Cr 1.7     Musculoskeletal negative musculoskeletal ROS (+)   Abdominal   Peds  Hematology  (+) anemia , Hgb 9.9   Anesthesia Other Findings URETHRAL STRICTURE, MICROSCOPIC HEMATURIA  Reproductive/Obstetrics                            Anesthesia Physical Anesthesia Plan  ASA: 3  Anesthesia Plan: General   Post-op Pain Management:    Induction: Intravenous  PONV Risk Score and Plan: 2 and Treatment may vary due to age or medical condition, Ondansetron and Dexamethasone  Airway Management Planned: LMA  Additional Equipment: None  Intra-op Plan:   Post-operative Plan: Extubation in OR  Informed Consent: I have reviewed the patients History and Physical, chart, labs and discussed the procedure including the risks, benefits and alternatives for the proposed anesthesia with the patient or authorized representative who has indicated his/her understanding and acceptance.     Dental advisory given  Plan Discussed with: CRNA  Anesthesia Plan Comments:        Anesthesia Quick Evaluation

## 2021-04-06 ENCOUNTER — Ambulatory Visit (HOSPITAL_BASED_OUTPATIENT_CLINIC_OR_DEPARTMENT_OTHER): Payer: Medicare Other | Admitting: Anesthesiology

## 2021-04-06 ENCOUNTER — Encounter (HOSPITAL_BASED_OUTPATIENT_CLINIC_OR_DEPARTMENT_OTHER): Payer: Self-pay | Admitting: Urology

## 2021-04-06 ENCOUNTER — Encounter (HOSPITAL_BASED_OUTPATIENT_CLINIC_OR_DEPARTMENT_OTHER)
Admission: RE | Disposition: A | Discharge status: Home / Self Care | Payer: Self-pay | Source: Ambulatory Visit | Attending: Urology

## 2021-04-06 ENCOUNTER — Ambulatory Visit (HOSPITAL_BASED_OUTPATIENT_CLINIC_OR_DEPARTMENT_OTHER)
Admission: RE | Admit: 2021-04-06 | Discharge: 2021-04-06 | Disposition: A | Discharge status: Home / Self Care | Payer: Medicare Other | Source: Ambulatory Visit | Attending: Urology | Admitting: Urology

## 2021-04-06 DIAGNOSIS — N471 Phimosis: Secondary | ICD-10-CM | POA: Diagnosis not present

## 2021-04-06 DIAGNOSIS — N5089 Other specified disorders of the male genital organs: Secondary | ICD-10-CM | POA: Diagnosis not present

## 2021-04-06 DIAGNOSIS — N3289 Other specified disorders of bladder: Secondary | ICD-10-CM | POA: Diagnosis not present

## 2021-04-06 DIAGNOSIS — Z87891 Personal history of nicotine dependence: Secondary | ICD-10-CM | POA: Insufficient documentation

## 2021-04-06 DIAGNOSIS — C680 Malignant neoplasm of urethra: Secondary | ICD-10-CM | POA: Insufficient documentation

## 2021-04-06 DIAGNOSIS — L723 Sebaceous cyst: Secondary | ICD-10-CM | POA: Diagnosis not present

## 2021-04-06 DIAGNOSIS — N4883 Acquired buried penis: Secondary | ICD-10-CM | POA: Insufficient documentation

## 2021-04-06 DIAGNOSIS — C679 Malignant neoplasm of bladder, unspecified: Secondary | ICD-10-CM | POA: Diagnosis not present

## 2021-04-06 DIAGNOSIS — N35813 Other membranous urethral stricture, male: Secondary | ICD-10-CM

## 2021-04-06 DIAGNOSIS — R3129 Other microscopic hematuria: Secondary | ICD-10-CM | POA: Insufficient documentation

## 2021-04-06 DIAGNOSIS — N35912 Unspecified bulbous urethral stricture, male: Secondary | ICD-10-CM | POA: Diagnosis not present

## 2021-04-06 DIAGNOSIS — N4289 Other specified disorders of prostate: Secondary | ICD-10-CM | POA: Diagnosis not present

## 2021-04-06 DIAGNOSIS — N35919 Unspecified urethral stricture, male, unspecified site: Secondary | ICD-10-CM | POA: Insufficient documentation

## 2021-04-06 DIAGNOSIS — I10 Essential (primary) hypertension: Secondary | ICD-10-CM | POA: Diagnosis not present

## 2021-04-06 HISTORY — DX: Other complications of anesthesia, initial encounter: T88.59XA

## 2021-04-06 HISTORY — DX: Unspecified glaucoma: H40.9

## 2021-04-06 HISTORY — PX: CYSTOSCOPY WITH URETHRAL DILATATION: SHX5125

## 2021-04-06 LAB — POCT I-STAT, CHEM 8
BUN: 13 mg/dL (ref 8–23)
Calcium, Ion: 1.17 mmol/L (ref 1.15–1.40)
Chloride: 109 mmol/L (ref 98–111)
Creatinine, Ser: 1.7 mg/dL — ABNORMAL HIGH (ref 0.61–1.24)
Glucose, Bld: 121 mg/dL — ABNORMAL HIGH (ref 70–99)
HCT: 29 % — ABNORMAL LOW (ref 39.0–52.0)
Hemoglobin: 9.9 g/dL — ABNORMAL LOW (ref 13.0–17.0)
Potassium: 4.6 mmol/L (ref 3.5–5.1)
Sodium: 138 mmol/L (ref 135–145)
TCO2: 19 mmol/L — ABNORMAL LOW (ref 22–32)

## 2021-04-06 LAB — GLUCOSE, CAPILLARY: Glucose-Capillary: 128 mg/dL — ABNORMAL HIGH (ref 70–99)

## 2021-04-06 SURGERY — CYSTOSCOPY, WITH URETHRAL DILATION
Anesthesia: General | Site: Urethra

## 2021-04-06 MED ORDER — CEFAZOLIN SODIUM-DEXTROSE 2-4 GM/100ML-% IV SOLN
INTRAVENOUS | Status: AC
  Filled 2021-04-06: qty 100

## 2021-04-06 MED ORDER — FENTANYL CITRATE (PF) 100 MCG/2ML IJ SOLN
INTRAMUSCULAR | Status: AC
  Filled 2021-04-06: qty 2

## 2021-04-06 MED ORDER — DEXAMETHASONE SODIUM PHOSPHATE 10 MG/ML IJ SOLN
INTRAMUSCULAR | Status: DC | PRN
  Administered 2021-04-06: 5 mg via INTRAVENOUS

## 2021-04-06 MED ORDER — ACETAMINOPHEN 500 MG PO TABS
ORAL_TABLET | ORAL | Status: AC
  Filled 2021-04-06: qty 2

## 2021-04-06 MED ORDER — ONDANSETRON HCL 4 MG/2ML IJ SOLN
INTRAMUSCULAR | Status: DC | PRN
  Administered 2021-04-06: 4 mg via INTRAVENOUS

## 2021-04-06 MED ORDER — ACETAMINOPHEN 500 MG PO TABS
1000.0000 mg | ORAL_TABLET | Freq: Once | ORAL | Status: AC
  Administered 2021-04-06: 1000 mg via ORAL

## 2021-04-06 MED ORDER — IOHEXOL 300 MG/ML  SOLN
INTRAMUSCULAR | Status: DC | PRN
  Administered 2021-04-06: 20 mL via URETHRAL

## 2021-04-06 MED ORDER — LIDOCAINE 2% (20 MG/ML) 5 ML SYRINGE
INTRAMUSCULAR | Status: DC | PRN
  Administered 2021-04-06: 100 mg via INTRAVENOUS

## 2021-04-06 MED ORDER — ONDANSETRON HCL 4 MG/2ML IJ SOLN
INTRAMUSCULAR | Status: AC
  Filled 2021-04-06: qty 2

## 2021-04-06 MED ORDER — LIDOCAINE HCL (PF) 1 % IJ SOLN
INTRAMUSCULAR | Status: DC | PRN
  Administered 2021-04-06: 1 mL
  Administered 2021-04-06: 10 mL

## 2021-04-06 MED ORDER — PHENYLEPHRINE HCL-NACL 20-0.9 MG/250ML-% IV SOLN
INTRAVENOUS | Status: DC | PRN
  Administered 2021-04-06: 60 ug/min via INTRAVENOUS

## 2021-04-06 MED ORDER — DEXAMETHASONE SODIUM PHOSPHATE 10 MG/ML IJ SOLN
INTRAMUSCULAR | Status: AC
  Filled 2021-04-06: qty 1

## 2021-04-06 MED ORDER — LACTATED RINGERS IV SOLN: INTRAVENOUS | Status: DC

## 2021-04-06 MED ORDER — NITROFURANTOIN MONOHYD MACRO 100 MG PO CAPS: 100.0000 mg | ORAL_CAPSULE | Freq: Every day | ORAL | 0 refills | Status: DC

## 2021-04-06 MED ORDER — PROPOFOL 10 MG/ML IV BOLUS
INTRAVENOUS | Status: DC | PRN
  Administered 2021-04-06: 40 mg via INTRAVENOUS
  Administered 2021-04-06: 30 mg via INTRAVENOUS
  Administered 2021-04-06: 200 mg via INTRAVENOUS
  Administered 2021-04-06 (×2): 30 mg via INTRAVENOUS

## 2021-04-06 MED ORDER — STERILE WATER FOR IRRIGATION IR SOLN
Status: DC | PRN
  Administered 2021-04-06: 6000 mL

## 2021-04-06 MED ORDER — LIDOCAINE HCL URETHRAL/MUCOSAL 2 % EX GEL
CUTANEOUS | Status: DC | PRN
  Administered 2021-04-06: 1 via TOPICAL

## 2021-04-06 MED ORDER — CEFAZOLIN SODIUM-DEXTROSE 2-4 GM/100ML-% IV SOLN
2.0000 g | Freq: Once | INTRAVENOUS | Status: AC
  Administered 2021-04-06: 2 g via INTRAVENOUS

## 2021-04-06 MED ORDER — FENTANYL CITRATE (PF) 100 MCG/2ML IJ SOLN
INTRAMUSCULAR | Status: DC | PRN
  Administered 2021-04-06 (×2): 50 ug via INTRAVENOUS
  Administered 2021-04-06 (×2): 25 ug via INTRAVENOUS
  Administered 2021-04-06: 50 ug via INTRAVENOUS

## 2021-04-06 MED ORDER — FENTANYL CITRATE (PF) 100 MCG/2ML IJ SOLN
25.0000 ug | INTRAMUSCULAR | Status: DC | PRN
  Administered 2021-04-06 (×2): 25 ug via INTRAVENOUS

## 2021-04-06 MED ORDER — LIDOCAINE 2% (20 MG/ML) 5 ML SYRINGE
INTRAMUSCULAR | Status: AC
  Filled 2021-04-06: qty 10

## 2021-04-06 MED ORDER — PHENYLEPHRINE 40 MCG/ML (10ML) SYRINGE FOR IV PUSH (FOR BLOOD PRESSURE SUPPORT)
PREFILLED_SYRINGE | INTRAVENOUS | Status: DC | PRN
  Administered 2021-04-06: 120 ug via INTRAVENOUS
  Administered 2021-04-06: 80 ug via INTRAVENOUS
  Administered 2021-04-06: 120 ug via INTRAVENOUS
  Administered 2021-04-06: 80 ug via INTRAVENOUS
  Administered 2021-04-06: 120 ug via INTRAVENOUS
  Administered 2021-04-06: 80 ug via INTRAVENOUS
  Administered 2021-04-06: 120 ug via INTRAVENOUS

## 2021-04-06 MED ORDER — PROPOFOL 10 MG/ML IV BOLUS
INTRAVENOUS | Status: AC
  Filled 2021-04-06: qty 20

## 2021-04-06 SURGICAL SUPPLY — 29 items
BAG DRAIN URO-CYSTO SKYTR STRL (DRAIN) ×2 IMPLANT
BAG DRN RND TRDRP ANRFLXCHMBR (UROLOGICAL SUPPLIES) ×1
BAG DRN UROCATH (DRAIN) ×1
BAG URINE DRAIN 2000ML AR STRL (UROLOGICAL SUPPLIES) ×1 IMPLANT
BALLN NEPHROSTOMY (BALLOONS)
BALLOON NEPHROSTOMY (BALLOONS) IMPLANT
BLADE SURG 15 STRL LF DISP TIS (BLADE) IMPLANT
BLADE SURG 15 STRL SS (BLADE) ×2
CATH FOLEY 2W COUNCIL 5CC 18FR (CATHETERS) ×1 IMPLANT
CATH FOLEY 2WAY SLVR  5CC 16FR (CATHETERS)
CATH FOLEY 2WAY SLVR 5CC 16FR (CATHETERS) IMPLANT
CLOTH BEACON ORANGE TIMEOUT ST (SAFETY) ×2 IMPLANT
ELECT REM PT RETURN 9FT ADLT (ELECTROSURGICAL)
ELECTRODE REM PT RTRN 9FT ADLT (ELECTROSURGICAL) IMPLANT
GLOVE SURG ENC MOIS LTX SZ7.5 (GLOVE) ×2 IMPLANT
GLOVE SURG ENC MOIS LTX SZ8 (GLOVE) IMPLANT
GOWN STRL REUS W/TWL LRG LVL3 (GOWN DISPOSABLE) ×2 IMPLANT
GUIDEWIRE ANG ZIPWIRE 038X150 (WIRE) ×1 IMPLANT
GUIDEWIRE STR DUAL SENSOR (WIRE) ×1 IMPLANT
HOLDER FOLEY CATH W/STRAP (MISCELLANEOUS) ×1 IMPLANT
KIT TURNOVER CYSTO (KITS) ×2 IMPLANT
MANIFOLD NEPTUNE II (INSTRUMENTS) ×2 IMPLANT
NEEDLE HYPO 22GX1.5 SAFETY (NEEDLE) ×1 IMPLANT
PACK CYSTO (CUSTOM PROCEDURE TRAY) ×2 IMPLANT
SUT VIC AB 3-0 SH 27 (SUTURE) ×4
SUT VIC AB 3-0 SH 27X BRD (SUTURE) IMPLANT
TUBE CONNECTING 12X1/4 (SUCTIONS) ×1 IMPLANT
WATER STERILE IRR 3000ML UROMA (IV SOLUTION) ×2 IMPLANT
WATER STERILE IRR 500ML POUR (IV SOLUTION) ×1 IMPLANT

## 2021-04-06 NOTE — Anesthesia Postprocedure Evaluation (Signed)
Anesthesia Post Note  Patient: Kevin Morgan  Procedure(s) Performed: CYSTOSCOPY WITH URETHRAL DILATATION; URETHRAL BIOPSY; DORSAL INCISION; RETROGRADE URETHRAGRAM (Urethra)     Patient location during evaluation: PACU Anesthesia Type: General Level of consciousness: awake and alert and oriented Pain management: pain level controlled Vital Signs Assessment: post-procedure vital signs reviewed and stable Respiratory status: spontaneous breathing, nonlabored ventilation and respiratory function stable Cardiovascular status: blood pressure returned to baseline Postop Assessment: no apparent nausea or vomiting Anesthetic complications: no   No notable events documented.  Last Vitals:  Vitals:   04/06/21 1115 04/06/21 1130  BP: 115/60 122/68  Pulse:    Resp: 20 (!) 21  Temp:  36.4 C  SpO2:  98%    Last Pain:  Vitals:   04/06/21 1110  TempSrc:   PainSc: [place]

## 2021-04-06 NOTE — Op Note (Signed)
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  He recently had a weak stream and felt a "pop" and began voiding better.  He had microscopic hematuria.  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  He was brought today to complete the hematuria evaluation, urethral stricture dilation and in the preop area asked if his phimosis could be addressed.  Findings: On exam under anesthesia the foreskin was tight with only the meatus visible.  It would retract no further.  There was a 5 mm sebaceous cyst on the left side of the foreskin that was excised but no other lesions.  Once the foreskin retracted there were no lesions in the glans and meatus appeared normal.  On cystoscopy the penile urethra was unremarkable, but in the bulbar urethra there was tissue protruding that looked like urethral prolapse.  I could not see a stricture nor a lumen and therefore backed the scope out and shot a retrograde urethrogram.  Retrograde urethrogram-this outlined narrowing of the proximal bulbar membranous and prostatic urethra with a serpiginous course of contrast up into the bladder.  Also appeared there may have been a posterior diverticulum possible pocket under the urethra or within the prostate.  Brachytherapy seeds were noted on the scout imaging.    This was a very unusual urethrogram and cystoscopic exam.  After passing a wire and coiling that in the bladder I was able to get a flexible and then rigid scope into the bladder and appeared much of the prostatic and membranous urethra had necrosis  and scarred and was sloughing.  I did not biopsy or pool on the tissue in the membranous bulbar urethra as it looks like this could disrupt a significant amount of his urethra and this might not heal.  It could be a fistula is already developing.  In the prostatic urethra there was some clearly more white slough tissue that was biopsied and sent to pathology.  It was difficult to get a very clear visual of the bladder but the bladder did not appear to have any mucosal lesions, stone or foreign body.  Because of the obvious breakdown of his urethra I did very minimal dilation or manipulation.  The [place], flexible cystoscope and then the 22 [place] cystoscope were passed and that was all.  Description of procedure: After consent was obtained patient brought to the operating room.  After adequate anesthesia he was placed lithotomy position and prepped and draped in the usual sterile fashion.  Timeout was performed for the patient and procedure.  I did a 10 cc dorsal penile block and then injected some lidocaine along the 12:00 part of the phimotic band of the foreskin.  I excised a small 5 mm sebaceous cyst on the left side of the foreskin.  The opening was closed with interrupted 3-0 Vicryl.  I then did a 12:00 crush clamped with a straight hemostat and incised the foreskin back to the coronal sulcus especially on the preputial surface.  I did go as far back on the penile surface.  This created a nice open foreskin the incision was irrigated and then run closed with 3-0 Vicryl to the left and to the right.  This created a  good functional foreskin.  His penis was slightly twisted but I do think this will still give him a good functional outcome.  Then passed the cystoscope per urethra and ran into a mass in the urethra.  I was able to work slightly around it and it look like protruding or prolapsing urethral tissue.  I was able to see what looked possibly like a scar tissue to the left but the urethra  looked to me like it wanted to go further toward the 12:00 direction.  Therefore thought it was best to backed the scope out prior to any manipulation and perform a retrograde pyelogram.  A 16 French catheter was passed into the penile urethra and the balloon inflated to 2 or 3 cc.  And then injected 20 cc of contrast.  The retrograde urethrogram was grossly abnormal with a meandering passage of contrast through the bulb and prostatic urethra up into the bladder.  Also possible pocket of contrast posterior to the urethra.  I then repassed the scope and advanced a zip wire what looked like a stricture but the wire went through the stricture and then coiled in the prostatic urethra.  I then went more anterior and was able to pass the wire and coiled that in the bladder.  I then took a [place] and gently passed that and it went easily into the bladder.  I then took a flexible cystoscope and went along the wire and was able to go into the bladder easily.  Bladder was inspected but with some bleeding from the prostatic urethra visualization was not completely clear.  I then backed out the cystoscope having confirmed proper luminal placement of the wire and I then repassed the rigid cystoscope and was able to follow the wire into the bladder.  The bladder neck and prostatic urethra felt fixed consistent with prior radiation.  I emptied the bladder and then reinspected and noted no obvious lesion.  I then backed the cystoscope back into the prostatic urethra and there was clearly some necrotic tissue that was detached and this was biopsied x2.  Further down the urethral tissue that had been prolapsing was more matted down and I moved around with the graspers but was concerned if I were to biopsy it it could remove a substantial piece of the urethra.  I thought it best to do minimal manipulation.  I wonder if he had a urethral stricture and then the "pop" he felt was possibly necrosis or disruption of his urethra.   The scope was then backed out and an 57 [place] council tip catheter was passed over the wire into the bladder with 14 cc in the balloon and then seated at the bladder neck.  Irrigation was clear.  The catheter was in a good spot fluoroscopically.  He was then awakened taken the cover room in stable condition.  Complications: None  Blood loss: Minimal  Specimens to pathology: Prostatic urethral biopsy x2  Drains: 18 French Foley catheter  Disposition: Patient stable to PACU

## 2021-04-06 NOTE — H&P (Signed)
H&P  Chief Complaint: Urethral stricture  History of Present Illness: Kevin Morgan is a 70 year old male with a history of brachytherapy.  His PSA has been undetectable.  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  A renal ultrasound was benign.  He presents today for urethral stricture dilation, completion of cystoscopy and OptiLube balloon urethral stricture dilation and Foley catheter placement.  He has been well.  No fevers.  No dysuria or gross hematuria. GU: He has a larger pannus and suprapubic fat pad which is causing somewhat of a buried penis. He does have phimosis although the meatus is visible I cannot pull the foreskin back to see the glans and neither can he.   Past Medical History:  Diagnosis Date   Anal fissure    Anemia    Anesthesia complication 58/85/0277   Patient states that with general anesthesia he has difficulty  waking up and has SOB.   Colon polyps 4128/7867   Complication of anesthesia    Depression    DM type 2    ED (erectile dysfunction)    Glaucoma    bilateral, mild   Hearing loss    Hyperlipidemia    Hypertension    Prostate cancer ([place]) 06/13/2008   seed implants   Proteinuria    Sleep apnea, obstructive    cpap used   Tachycardia    Urticaria    Past Surgical History:  Procedure Laterality Date   APPENDECTOMY  05/1996   mesh used- ruptured  appendix   COLONOSCOPY WITH PROPOFOL N/A 09/23/2014   Procedure: COLONOSCOPY WITH PROPOFOL;  Surgeon: [name], MD;  Location: [facility];  Service: Endoscopy;  Laterality: N/A;   INCISIONAL HERNIA REPAIR  05/1996   s/p appendectomy-open   INSERTION PROSTATE RADIATION SEED     >10 yrs ago as of 04/01/2021    Home Medications:  Medications Prior to Admission  Medication Sig Dispense Refill Last Dose   Cholecalciferol (VITAMIN D3) 1.25 MG (50000 UT) CAPS Take by mouth daily.      losartan (COZAAR) 50 MG tablet Take 50 mg by mouth daily.       sodium bicarbonate 650 MG tablet Take 650 mg by mouth 2 (two) times daily.      acetaminophen (TYLENOL) 650 MG CR tablet Take 650 mg by mouth every 8 (eight) hours as needed for pain.      aspirin 81 MG tablet Take 81 mg by mouth as needed (Patient hasn't taken in a month as of 04/01/21.).      cyanocobalamin 1000 MCG tablet Take 100 mcg by mouth daily.      diltiazem (CARDIZEM CD) 240 MG 24 hr capsule TAKE ONE CAPSULE BY MOUTH EVERY DAY (Patient taking differently: 240 mg at bedtime.) 90 capsule 3    famotidine (PEPCID) 20 MG tablet Take 20 mg by mouth daily.      fexofenadine (ALLEGRA) 180 MG tablet Take 180 mg by mouth daily.      metFORMIN (GLUCOPHAGE) 500 MG tablet Take 1,000 mg by mouth 2 (two) times daily.       simvastatin (ZOCOR) 20 MG tablet Take 20 mg by mouth every evening.      sodium chloride (OCEAN) 0.65 % SOLN nasal spray Place 1 spray into both nostrils as needed for congestion.      tamsulosin (FLOMAX) 0.4 MG CAPS capsule Take 0.4 mg by mouth at bedtime.  11    Allergies:  Allergies  Allergen Reactions  Cinnamon Other (See Comments)    Lips and eye swelling.    Hydrocodone-Acetaminophen Swelling   Naprosyn [Naproxen] Hives    Family History  Problem Relation Age of Onset   Cancer - Colon Father    Cancer - Colon Brother    Cancer - Colon Maternal Uncle    Social History:  reports that he quit smoking about 52 years ago. His smoking use included cigarettes. He has never used smokeless tobacco. He reports that he does not drink alcohol and does not use drugs.  ROS: A complete review of systems was performed.  All systems are negative except for pertinent findings as noted. Review of Systems  All other systems reviewed and are negative.   Physical Exam:  Vital signs in last 24 hours:   General:  Alert and oriented, No acute distress HEENT: Normocephalic, atraumatic Cardiovascular: Regular rate and rhythm Lungs: Regular rate and effort Abdomen: Soft,  nontender, nondistended, no abdominal masses Back: No CVA tenderness Extremities: No edema Neurologic: Grossly intact  Laboratory Data:  No results found for this or any previous visit (from the past 24 hour(s)). No results found for this or any previous visit (from the past 240 hour(s)). Creatinine: No results for input(s): CREATININE in the last 168 hours.  Impression/Assessment:  Urethral stricture  Plan:  I discussed with the patient the nature, potential benefits, risks and alternatives to cystoscopy, urethral stricture dilation with possible OptiLume balloon dilation, including side effects of the proposed treatment, the likelihood of the patient achieving the goals of the procedure, and any potential problems that might occur during the procedure or recuperation.  We talked about using a condom for 30 days postop and contraception for 6 months.  This brought up another issue.  Patient said he has not been sexually active.  He says he has bothersome phimosis and cannot retract the foreskin to see the penis.  We went over the nature risk benefits and alternatives to dorsal foreskin incision and he elects to proceed.  I would not do a formal circumcision as I'd be concerned about the penis bearing and becoming further scarred over time.  All questions answered. Patient elects to proceed.   Festus Aloe 04/06/2021

## 2021-04-06 NOTE — Discharge Instructions (Signed)
  Post Anesthesia Home Care Instructions  Activity: Get plenty of rest for the remainder of the day. A responsible individual must stay with you for 24 hours following the procedure.  For the next 24 hours, DO NOT: -Drive a car -Operate machinery -Drink alcoholic beverages -Take any medication unless instructed by your physician -Make any legal decisions or sign important papers.  Meals: Start with liquid foods such as gelatin or soup. Progress to regular foods as tolerated. Avoid greasy, spicy, heavy foods. If nausea and/or vomiting occur, drink only clear liquids until the nausea and/or vomiting subsides. Call your physician if vomiting continues.  Special Instructions/Symptoms: Your throat may feel dry or sore from the anesthesia or the breathing tube placed in your throat during surgery. If this causes discomfort, gargle with warm salt water. The discomfort should disappear within 24 hours.  CYSTOSCOPY HOME CARE INSTRUCTIONS  Activity: Rest for the remainder of the day.  Do not drive or operate equipment today.  You may resume normal activities in one to two days as instructed by your physician.   Meals: Drink plenty of liquids and eat light foods such as gelatin or soup this evening.  You may return to a normal meal plan tomorrow.  Return to Work: You may return to work in one to two days or as instructed by your physician.  Special Instructions / Symptoms: Call your physician if any of these symptoms occur:   -persistent or heavy bleeding  -bleeding which continues after first few urination  -large blood clots that are difficult to pass  -urine stream diminishes or stops completely  -fever equal to or higher than 101 degrees Farenheit.  -cloudy urine with a strong, foul odor  -severe pain  Females should always wipe from front to back after elimination.  You may feel some burning pain when you urinate.  This should disappear with time.  Applying moist heat to the lower  abdomen or a hot tub bath may help relieve the pain.   

## 2021-04-06 NOTE — Transfer of Care (Signed)
Immediate Anesthesia Transfer of Care Note  Patient: Kevin Morgan  Procedure(s) Performed: CYSTOSCOPY WITH URETHRAL DILATATION; URETHRAL BIOPSY; DORSAL INCISION; RETROGRADE URETHRAGRAM (Urethra)  Patient Location: PACU  Anesthesia Type:General  Level of Consciousness: drowsy and patient cooperative  Airway & Oxygen Therapy: Patient Spontanous Breathing and Patient connected to face mask oxygen  Post-op Assessment: Report given to RN and Post -op Vital signs reviewed and stable  Post vital signs: Reviewed and stable  Last Vitals:  Vitals Value Taken Time  BP    Temp    Pulse    Resp 17 04/06/21 1036  SpO2    Vitals shown include unvalidated device data.  Last Pain:  Vitals:   04/06/21 0807  TempSrc: Oral  PainSc: 0-No pain      Patients Stated Pain Goal: 5 (88/67/73 7366)  Complications: No notable events documented.

## 2021-04-06 NOTE — Anesthesia Procedure Notes (Signed)
Procedure Name: LMA Insertion Date/Time: 04/06/2021 9:06 AM Performed by: Gwyndolyn Saxon, CRNA Pre-anesthesia Checklist: Patient identified, Emergency Drugs available, Suction available and Patient being monitored Patient Re-evaluated:Patient Re-evaluated prior to induction Oxygen Delivery Method: Circle system utilized Preoxygenation: Pre-oxygenation with 100% oxygen Induction Type: IV induction Ventilation: Mask ventilation without difficulty LMA: LMA inserted LMA Size: 5.0 Number of attempts: 1 Airway Equipment and Method: Patient positioned with wedge pillow Placement Confirmation: positive ETCO2 and breath sounds checked- equal and bilateral Tube secured with: Tape Dental Injury: Teeth and Oropharynx as per pre-operative assessment

## 2021-04-07 ENCOUNTER — Encounter (HOSPITAL_BASED_OUTPATIENT_CLINIC_OR_DEPARTMENT_OTHER): Payer: Self-pay | Admitting: Urology

## 2021-04-07 ENCOUNTER — Telehealth: Payer: Self-pay | Admitting: Cardiology

## 2021-04-07 DIAGNOSIS — R238 Other skin changes: Secondary | ICD-10-CM | POA: Diagnosis not present

## 2021-04-07 NOTE — Telephone Encounter (Signed)
Patient wants to know if his upcoming appt can be a virtual appt instead of an in office visit.

## 2021-04-07 NOTE — Telephone Encounter (Signed)
Will forward to Dr. Marlou Porch to see if it's okay for patient's office visit to be changed to virtual visit on Friday.

## 2021-04-08 LAB — SURGICAL PATHOLOGY

## 2021-04-08 NOTE — Telephone Encounter (Signed)
Called patient back to let him know a virtual visit is fine with Dr. Marlou Porch. Sent mychart sign up by text to patient's phone. Patient will try to get mychart set up. If not patient can receive text and still do a video visit with his smart phone.

## 2021-04-09 ENCOUNTER — Telehealth (INDEPENDENT_AMBULATORY_CARE_PROVIDER_SITE_OTHER): Payer: Medicare Other | Admitting: Cardiology

## 2021-04-09 ENCOUNTER — Encounter: Payer: Self-pay | Admitting: Cardiology

## 2021-04-09 ENCOUNTER — Other Ambulatory Visit: Payer: Self-pay

## 2021-04-09 DIAGNOSIS — R000 Tachycardia, unspecified: Secondary | ICD-10-CM

## 2021-04-09 DIAGNOSIS — E782 Mixed hyperlipidemia: Secondary | ICD-10-CM | POA: Diagnosis not present

## 2021-04-09 NOTE — Patient Instructions (Signed)
Medication Instructions:  Your physician has recommended you make the following change in your medication:   *If you need a refill on your cardiac medications before your next appointment, please call your pharmacy*   Lab Work: None If you have labs (blood work) drawn today and your tests are completely normal, you will receive your results only by: [place] (if you have MyChart) OR A paper copy in the mail If you have any lab test that is abnormal or we need to change your treatment, we will call you to review the results.   Follow-Up: At [facility], you and your health needs are our priority.  As part of our continuing mission to provide you with exceptional heart care, we have created designated Provider Care Teams.  These Care Teams include your primary Cardiologist (physician) and Advanced Practice Providers (APPs -  Physician Assistants and Nurse Practitioners) who all work together to provide you with the care you need, when you need it.  Your next appointment:   1 year(s)  The format for your next appointment:   In Person  Provider:   You may see Candee Furbish, MD or one of the following Advanced Practice Providers on your designated Care Team:   Cecilie Kicks, NP

## 2021-04-09 NOTE — Assessment & Plan Note (Signed)
Continue with simvastatin 20 mg every evening.  No changes made.  No myalgias.

## 2021-04-09 NOTE — Assessment & Plan Note (Signed)
Continue to encourage weight loss.  Decrease carbohydrates. ?

## 2021-04-09 NOTE — Assessment & Plan Note (Signed)
Doing well with diltiazem 240 mg once a day.  No changes made.  Controlling his heart rate.

## 2021-04-09 NOTE — Progress Notes (Signed)
MyChart Video Visit    Virtual Visit via Video Note   This visit type was conducted due to national recommendations for restrictions regarding the COVID-19 Pandemic (e.g. social distancing) in an effort to limit this patient's exposure and mitigate transmission in our community. This patient is at least at moderate risk for complications without adequate follow up. This format is felt to be most appropriate for this patient at this time. Physical exam was limited by quality of the video and audio technology used for the visit. able to get the patient set up on a video visit.  Patient location: Home Patient and provider in visit Provider location: Office  I discussed the limitations of evaluation and management by telemedicine and the availability of in person appointments. The patient expressed understanding and agreed to proceed.  Visit Date: 04/09/2021  Today's healthcare provider: Candee Furbish, MD     Subjective:    Patient ID: Kevin Morgan, male    DOB: September 09, 1950, 70 y.o.   MRN: [id number]  No chief complaint on file.   HPI Patient is in today for a 1 year  follow-up on tachycardia and hyperlipidemia.  EKG done on 04/06/2021 showed NSR rate: 85 bpm.  He mentions he is doing well.  At his last surgery, he was checked out and they had no issues with his heart. His urologist fund out he hd an infections on his kidneys and bladder.  At his last visit to hs nephrologist, they found some issues with his kidneys.   His last EKG in 2019 showed a high heat rate which is being managed with 240 mg diltiazem PO daily and he is doing well on it.  He denies chest pain.   Past Medical History:  Diagnosis Date   Anal fissure    Anemia    Anesthesia complication 16/06/930   Patient states that with general anesthesia he has difficulty  waking up and has SOB.   Colon polyps 3557/3220   Complication of anesthesia    Depression    DM type 2    ED (erectile dysfunction)     Glaucoma    bilateral, mild   Hearing loss    Hyperlipidemia    Hypertension    Prostate cancer ([place]) 06/13/2008   seed implants   Proteinuria    Sleep apnea, obstructive    cpap used   Tachycardia    Urticaria     Past Surgical History:  Procedure Laterality Date   APPENDECTOMY  05/1996   mesh used- ruptured  appendix   COLONOSCOPY WITH PROPOFOL N/A 09/23/2014   Procedure: COLONOSCOPY WITH PROPOFOL;  Surgeon: [name], MD;  Location: [facility];  Service: Endoscopy;  Laterality: N/A;   CYSTOSCOPY WITH URETHRAL DILATATION N/A 04/06/2021   Procedure: CYSTOSCOPY WITH URETHRAL DILATATION; URETHRAL BIOPSY; DORSAL INCISION; RETROGRADE URETHRAGRAM;  Surgeon: Festus Aloe, MD;  Location: [facility];  Service: Urology;  Laterality: N/A;   INCISIONAL HERNIA REPAIR  05/1996   s/p appendectomy-open   INSERTION PROSTATE RADIATION SEED     >10 yrs ago as of 04/01/2021    Family History  Problem Relation Age of Onset   Cancer - Colon Father    Cancer - Colon Brother    Cancer - Colon Maternal Uncle     Social History   Socioeconomic History   Marital status: Divorced    Spouse name: Not on file   Number of children: Not on file   Years of education:  Not on file   Highest education level: Not on file  Occupational History   Not on file  Tobacco Use   Smoking status: Former    Types: Cigarettes    Quit date: 09/07/1968    Years since quitting: 52.6   Smokeless tobacco: Never  Vaping Use   Vaping Use: Never used  Substance and Sexual Activity   Alcohol use: No   Drug use: No   Sexual activity: Not on file  Other Topics Concern   Not on file  Social History Narrative   Not on file   Social Determinants of Health   Financial Resource Strain: Not on file  Food Insecurity: Not on file  Transportation Needs: Not on file  Physical Activity: Not on file  Stress: Not on file  Social Connections: Not on file  Intimate Partner Violence: Not  on file    Outpatient Medications Prior to Visit  Medication Sig Dispense Refill   acetaminophen (TYLENOL) 650 MG CR tablet Take 650 mg by mouth every 8 (eight) hours as needed for pain.     aspirin 81 MG tablet Take 81 mg by mouth as needed (Patient hasn't taken in a month as of 04/01/21.).     cephALEXin (KEFLEX) 500 MG capsule Take 500 mg by mouth at bedtime.     Cholecalciferol (VITAMIN D3) 1.25 MG (50000 UT) CAPS Take by mouth daily.     cyanocobalamin 1000 MCG tablet Take 100 mcg by mouth daily.     diltiazem (CARDIZEM CD) 240 MG 24 hr capsule TAKE ONE CAPSULE BY MOUTH EVERY DAY (Patient taking differently: 240 mg at bedtime.) 90 capsule 3   famotidine (PEPCID) 20 MG tablet Take 20 mg by mouth daily.     fexofenadine (ALLEGRA) 180 MG tablet Take 180 mg by mouth daily.     losartan (COZAAR) 50 MG tablet Take 50 mg by mouth daily.     metFORMIN (GLUCOPHAGE) 500 MG tablet Take 1,000 mg by mouth 2 (two) times daily.      nitrofurantoin, macrocrystal-monohydrate, (MACROBID) 100 MG capsule Take 1 capsule (100 mg total) by mouth at bedtime. 10 capsule 0   simvastatin (ZOCOR) 20 MG tablet Take 20 mg by mouth every evening.     sodium bicarbonate 650 MG tablet Take 650 mg by mouth 2 (two) times daily.     sodium chloride (OCEAN) 0.65 % SOLN nasal spray Place 1 spray into both nostrils as needed for congestion.     tamsulosin (FLOMAX) 0.4 MG CAPS capsule Take 0.4 mg by mouth at bedtime.  11   No facility-administered medications prior to visit.    Allergies  Allergen Reactions   Cinnamon Other (See Comments)    Lips and eye swelling.    Hydrocodone-Acetaminophen Swelling   Naprosyn [Naproxen] Hives    ROS     Objective:    Physical Exam  BP (!) 116/40   Pulse 80   Ht 5\' 7"  (1.702 m)   Wt 260 lb (117.9 kg)   BMI 40.72 kg/m  Wt Readings from Last 3 Encounters:  04/09/21 260 lb (117.9 kg)  04/06/21 262 lb 6.4 oz (119 kg)  10/22/19 284 lb (128.8 kg)    Diabetic Foot Exam -  Simple   No data filed    Lab Results  Component Value Date   HGB 9.9 (L) 04/06/2021   HCT 29.0 (L) 04/06/2021   GLUCOSE 121 (H) 04/06/2021   ALT 35 12/01/2008   AST 32 12/01/2008  NA 138 04/06/2021   K 4.6 04/06/2021   CL 109 04/06/2021   CREATININE 1.70 (H) 04/06/2021   BUN 13 04/06/2021   CO2 23 12/01/2008    No results found for: TSH Lab Results  Component Value Date   HGB 9.9 (L) 04/06/2021   HCT 29.0 (L) 04/06/2021   Lab Results  Component Value Date   NA 138 04/06/2021   K 4.6 04/06/2021   CO2 23 12/01/2008   GLUCOSE 121 (H) 04/06/2021   BUN 13 04/06/2021   CREATININE 1.70 (H) 04/06/2021   BILITOT 0.6 12/01/2008   ALKPHOS 85 12/01/2008   AST 32 12/01/2008   ALT 35 12/01/2008   PROT 8.2 12/01/2008   ALBUMIN 3.9 12/01/2008   CALCIUM 9.1 12/01/2008   No results found for: CHOL No results found for: HDL No results found for: LDLCALC No results found for: TRIG No results found for: CHOLHDL No results found for: HGBA1C      Follow-up in 1 year.  Assessment & Plan:  Tachycardia Doing well with diltiazem 240 mg once a day.  No changes made.  Controlling his heart rate.  Morbid obesity Continue to encourage weight loss.  Decrease carbohydrates.  Hyperlipidemia Continue with simvastatin 20 mg every evening.  No changes made.  No myalgias.   Problem List Items Addressed This Visit     Tachycardia    Doing well with diltiazem 240 mg once a day.  No changes made.  Controlling his heart rate.      Morbid obesity ([place])    Continue to encourage weight loss.  Decrease carbohydrates.      Hyperlipidemia    Continue with simvastatin 20 mg every evening.  No changes made.  No myalgias.        No orders of the defined types were placed in this encounter.   I discussed the assessment and treatment plan with the patient. The patient was provided an opportunity to ask questions and all were answered. The patient agreed with the plan and demonstrated  an understanding of the instructions.   The patient was advised to call back or seek an in-person evaluation if the symptoms worsen or if the condition fails to improve as anticipated.  I provided 20 minutes of face-to-face time during this encounter.   I,Zite Okoli,acting as a Education administrator for [employer], MD.,have documented all relevant documentation on the behalf of Candee Furbish, MD,as directed by  Candee Furbish, MD while in the presence of Candee Furbish, MD.   I, Candee Furbish, MD, have reviewed all documentation for this visit. The documentation on 04/09/21 for the exam, diagnosis, procedures, and orders are all accurate and complete.    Candee Furbish, MD [place] Office [phone] (phone) [phone] (fax)  [place]

## 2021-04-12 DIAGNOSIS — N1832 Chronic kidney disease, stage 3b: Secondary | ICD-10-CM | POA: Diagnosis not present

## 2021-04-22 DIAGNOSIS — I700 Atherosclerosis of aorta: Secondary | ICD-10-CM | POA: Diagnosis not present

## 2021-04-22 DIAGNOSIS — K5730 Diverticulosis of large intestine without perforation or abscess without bleeding: Secondary | ICD-10-CM | POA: Diagnosis not present

## 2021-04-22 DIAGNOSIS — N400 Enlarged prostate without lower urinary tract symptoms: Secondary | ICD-10-CM | POA: Diagnosis not present

## 2021-04-22 DIAGNOSIS — C680 Malignant neoplasm of urethra: Secondary | ICD-10-CM | POA: Diagnosis not present

## 2021-04-22 DIAGNOSIS — J9859 Other diseases of mediastinum, not elsewhere classified: Secondary | ICD-10-CM | POA: Diagnosis not present

## 2021-04-22 DIAGNOSIS — K8020 Calculus of gallbladder without cholecystitis without obstruction: Secondary | ICD-10-CM | POA: Diagnosis not present

## 2021-04-22 DIAGNOSIS — I2510 Atherosclerotic heart disease of native coronary artery without angina pectoris: Secondary | ICD-10-CM | POA: Diagnosis not present

## 2021-04-22 DIAGNOSIS — N200 Calculus of kidney: Secondary | ICD-10-CM | POA: Diagnosis not present

## 2021-04-29 DIAGNOSIS — C680 Malignant neoplasm of urethra: Secondary | ICD-10-CM | POA: Diagnosis not present

## 2021-05-17 ENCOUNTER — Other Ambulatory Visit (HOSPITAL_COMMUNITY): Payer: Self-pay | Admitting: Urology

## 2021-05-17 DIAGNOSIS — C680 Malignant neoplasm of urethra: Secondary | ICD-10-CM

## 2021-05-26 ENCOUNTER — Other Ambulatory Visit: Payer: Self-pay

## 2021-05-26 ENCOUNTER — Encounter (HOSPITAL_COMMUNITY)
Admission: RE | Admit: 2021-05-26 | Discharge: 2021-05-26 | Disposition: A | Discharge status: Home / Self Care | Payer: Medicare Other | Source: Ambulatory Visit | Attending: Urology | Admitting: Urology

## 2021-05-26 DIAGNOSIS — C679 Malignant neoplasm of bladder, unspecified: Secondary | ICD-10-CM | POA: Diagnosis not present

## 2021-05-26 DIAGNOSIS — C680 Malignant neoplasm of urethra: Secondary | ICD-10-CM | POA: Diagnosis not present

## 2021-05-26 DIAGNOSIS — K5790 Diverticulosis of intestine, part unspecified, without perforation or abscess without bleeding: Secondary | ICD-10-CM | POA: Diagnosis not present

## 2021-05-26 DIAGNOSIS — J9859 Other diseases of mediastinum, not elsewhere classified: Secondary | ICD-10-CM | POA: Diagnosis not present

## 2021-05-26 DIAGNOSIS — N261 Atrophy of kidney (terminal): Secondary | ICD-10-CM | POA: Diagnosis not present

## 2021-05-26 LAB — GLUCOSE, CAPILLARY: Glucose-Capillary: 140 mg/dL — ABNORMAL HIGH (ref 70–99)

## 2021-05-26 MED ORDER — FLUDEOXYGLUCOSE F - 18 (FDG) INJECTION: 12.3000 | Freq: Once | INTRAVENOUS | Status: DC | PRN

## 2021-05-31 DIAGNOSIS — H401131 Primary open-angle glaucoma, bilateral, mild stage: Secondary | ICD-10-CM | POA: Diagnosis not present

## 2021-06-02 DIAGNOSIS — C61 Malignant neoplasm of prostate: Secondary | ICD-10-CM | POA: Diagnosis not present

## 2021-06-02 DIAGNOSIS — C679 Malignant neoplasm of bladder, unspecified: Secondary | ICD-10-CM | POA: Diagnosis not present

## 2021-06-02 DIAGNOSIS — Z7984 Long term (current) use of oral hypoglycemic drugs: Secondary | ICD-10-CM | POA: Diagnosis not present

## 2021-06-02 DIAGNOSIS — E1169 Type 2 diabetes mellitus with other specified complication: Secondary | ICD-10-CM | POA: Diagnosis not present

## 2021-06-02 DIAGNOSIS — I10 Essential (primary) hypertension: Secondary | ICD-10-CM | POA: Diagnosis not present

## 2021-06-02 DIAGNOSIS — G4733 Obstructive sleep apnea (adult) (pediatric): Secondary | ICD-10-CM | POA: Diagnosis not present

## 2021-06-09 ENCOUNTER — Telehealth: Payer: Self-pay | Admitting: Genetic Counselor

## 2021-06-09 NOTE — Telephone Encounter (Signed)
Scheduled appt per 12/23 referral. Pt is aware of appt date and time. Pt is aware to arrive 15 mins prior to appt time.

## 2021-06-16 ENCOUNTER — Encounter: Payer: Self-pay | Admitting: Genetic Counselor

## 2021-06-16 ENCOUNTER — Inpatient Hospital Stay: Payer: Medicare PPO | Attending: Genetic Counselor | Admitting: Genetic Counselor

## 2021-06-16 ENCOUNTER — Other Ambulatory Visit: Payer: Self-pay | Admitting: Genetic Counselor

## 2021-06-16 ENCOUNTER — Other Ambulatory Visit: Payer: Self-pay

## 2021-06-16 ENCOUNTER — Inpatient Hospital Stay: Payer: Medicare PPO

## 2021-06-16 DIAGNOSIS — Z8546 Personal history of malignant neoplasm of prostate: Secondary | ICD-10-CM | POA: Diagnosis not present

## 2021-06-16 DIAGNOSIS — Z803 Family history of malignant neoplasm of breast: Secondary | ICD-10-CM

## 2021-06-16 DIAGNOSIS — C669 Malignant neoplasm of unspecified ureter: Secondary | ICD-10-CM | POA: Diagnosis not present

## 2021-06-16 DIAGNOSIS — Z800 Family history of malignant neoplasm of digestive organs: Secondary | ICD-10-CM

## 2021-06-16 DIAGNOSIS — C689 Malignant neoplasm of urinary organ, unspecified: Secondary | ICD-10-CM | POA: Insufficient documentation

## 2021-06-16 DIAGNOSIS — Z8042 Family history of malignant neoplasm of prostate: Secondary | ICD-10-CM

## 2021-06-16 LAB — GENETIC SCREENING ORDER

## 2021-06-16 NOTE — Progress Notes (Addendum)
REFERRING PROVIDER: Festus Aloe, MD [place],  [place] [address]  PRIMARY PROVIDER:  [name], MD  PRIMARY REASON FOR VISIT:  1. Family history of prostate cancer   2. Family history of breast cancer   3. Personal history of prostate cancer   4. Malignant neoplasm of ureter, unspecified laterality ([place])      HISTORY OF PRESENT ILLNESS:   [name], a 71 y.o. male, was seen for a [facility] cancer genetics consultation at the request of Dr. Junious Silk due to a personal and family history of cancer.  Kevin Morgan presents to clinic today to discuss the possibility of a hereditary predisposition to cancer, genetic testing, and to further clarify his future cancer risks, as well as potential cancer risks for family members.   In the early 2000's, at the age of 34, Kevin Morgan was diagnosed with prostate cancer.  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 He reports having greater than 12 colon polyps in his lifetime on his colonoscopies.   CANCER HISTORY:  Oncology History   No history exists.     Past Medical History:  Diagnosis Date   Anal fissure    Anemia    Anesthesia complication 06/15/7251   Patient states that with general anesthesia he has difficulty  waking up and has SOB.   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[place])    Urticaria     Past Surgical History:  Procedure Laterality Date   APPENDECTOMY  05/1996   mesh used- ruptured  appendix   COLONOSCOPY WITH PROPOFOL N/A 09/23/2014   Procedure:  COLONOSCOPY WITH PROPOFOL;  Surgeon: [name], MD;  Location: [facility];  Service: Endoscopy;  Laterality: N/A;   CYSTOSCOPY WITH URETHRAL DILATATION N/A 04/06/2021   Procedure: CYSTOSCOPY WITH URETHRAL DILATATION; URETHRAL BIOPSY; DORSAL INCISION; RETROGRADE URETHRAGRAM;  Surgeon: Festus Aloe, MD;  Location: [facility];  Service: Urology;  Laterality: N/A;   INCISIONAL HERNIA REPAIR  05/1996   s/p appendectomy-open   INSERTION PROSTATE RADIATION SEED     >10 yrs ago as of 04/01/2021    Social History   Socioeconomic History   Marital status: Divorced    Spouse name: Not on file   Number of children: Not on file   Years of education: Not on file   Highest education level: Not on file  Occupational History   Not on file  Tobacco Use   Smoking status: Former    Types: Cigarettes    Quit date: 09/07/1968    Years since quitting: 52.8   Smokeless tobacco: Never  Vaping Use   Vaping Use: Never used  Substance and Sexual Activity   Alcohol use: No   Drug use: No   Sexual activity: Not on file  Other Topics Concern   Not on file  Social History Narrative   Not on file   Social  Determinants of Health   Financial Resource Strain: Not on file  Food Insecurity: Not on file  Transportation Needs: Not on file  Physical Activity: Not on file  Stress: Not on file  Social Connections: Not on file     FAMILY HISTORY:  We obtained a detailed, 4-generation family history.  Significant diagnoses are listed below: Family History  Problem Relation Age of Onset   Prostate cancer Father    Breast cancer Sister    Lung cancer Brother    Prostate cancer Maternal Uncle    Prostate cancer Maternal Uncle    Heart Problems Maternal Grandmother    Heart Problems Maternal Grandfather    Heart Problems Paternal Grandmother    Heart Problems Paternal Grandfather    Prostate cancer Nephew 94    The patient has one son who is cancer free.  He has two brothers  and three sisters.  One sister had breast cancer in her 21's., and one brother has a son who has prostate cancer.  Both parents are deceased.  The patient's father had prostate cancer. He had many siblings who were cancer free. His parents died of heart disease.  The patient's mother did not have cancer.  She had two brothers with prostate cancer.  There is no other reported cancer on her side of the family.  Mr. Turman is unaware of previous family history of genetic testing for hereditary cancer risks. Patient's maternal ancestors are of African American descent, and paternal ancestors are of African American descent. There is no reported Ashkenazi Jewish ancestry. There is no known consanguinity.  GENETIC COUNSELING ASSESSMENT: Mr. Metayer is a 71 y.o. male with a personal and family history of cancer which is somewhat suggestive of a hereditary cancer syndrome and predisposition to cancer given the combination of cancer and number of people in the family with cancer. We, therefore, discussed and recommended the following at today's visit.   DISCUSSION: We discussed that, in general, most cancer is not inherited in families, but instead is sporadic or familial. Sporadic cancers occur by chance and typically happen at older ages (>50 years) as this type of cancer is caused by genetic changes acquired during an individuals lifetime. Some families have more cancers than would be expected by chance; however, the ages or types of cancer are not consistent with a known genetic mutation or known genetic mutations have been ruled out. This type of familial cancer is thought to be due to a combination of multiple genetic, environmental, hormonal, and lifestyle factors. While this combination of factors likely increases the risk of cancer, the exact source of this risk is not currently identifiable or testable.  We discussed that up to 15% of prostate cancer is hereditary, with most cases associated with BRCA  mutations.  There are other genes that can be associated with hereditary prostate cancer syndromes.  These include HOXB13, other breast cancer genes and Lynch syndrome.  We discussed that testing is beneficial for several reasons including knowing how to follow individuals after completing their treatment, identifying whether potential treatment options such as PARP inhibitors would be beneficial, and understand if other family members could be at risk for cancer and allow them to undergo genetic testing.   The patient also reports having 13 colon polyps on his first colonoscopy and at least one more polyp on subsequent colonoscopies.  We discussed polyposis and that having more than 10 polyps increases the risk for colon cancer, and also is a risk factor for hereditary  polyposis syndromes.  This alone allows him to meet medical criteria for genetic testing for polyposis syndromes.  We reviewed the characteristics, features and inheritance patterns of hereditary cancer syndromes. We also discussed genetic testing, including the appropriate family members to test, the process of testing, insurance coverage and turn-around-time for results. We discussed the implications of a negative, positive, carrier and/or variant of uncertain significant result. We recommended Mr. Graumann pursue genetic testing for the CancerNext-Expanded+RNAinsight gene panel.   The CancerNext-Expanded gene panel offered by [facility] and includes sequencing and rearrangement analysis for the following 77 genes: AIP, ALK, APC*, ATM*, AXIN2, BAP1, BARD1, BLM, BMPR1A, BRCA1*, BRCA2*, BRIP1*, CDC73, CDH1*, CDK4, CDKN1B, CDKN2A, CHEK2*, CTNNA1, DICER1, FANCC, FH, FLCN, GALNT12, KIF1B, LZTR1, MAX, MEN1, MET, MLH1*, MSH2*, MSH3, MSH6*, MUTYH*, NBN, NF1*, NF2, NTHL1, PALB2*, PHOX2B, PMS2*, POT1, PRKAR1A, PTCH1, PTEN*, RAD51C*, RAD51D*, RB1, RECQL, RET, SDHA, SDHAF2, SDHB, SDHC, SDHD, SMAD4, SMARCA4, SMARCB1, SMARCE1, STK11, SUFU, TMEM127, TP53*,  TSC1, TSC2, VHL and XRCC2 (sequencing and deletion/duplication); EGFR, EGLN1, HOXB13, KIT, MITF, PDGFRA, POLD1, and POLE (sequencing only); EPCAM and GREM1 (deletion/duplication only). DNA and RNA analyses performed for * genes.   Based on Mr. Bolds personal and family history of cancer, he meets medical criteria for genetic testing. Despite that he meets criteria, he may still have an out of pocket cost. We discussed that if his out of pocket cost for testing is over $100, the laboratory will call and confirm whether he wants to proceed with testing.  If the out of pocket cost of testing is less than $100 he will be billed by the genetic testing laboratory.   PLAN: After considering the risks, benefits, and limitations, Mr. Visconti provided informed consent to pursue genetic testing and the blood sample was sent to [facility] for analysis of the CancerNext-Expanded+RNAinsight. Results should be available within approximately 2-3 weeks' time, at which point they will be disclosed by telephone to Mr. Castell, as will any additional recommendations warranted by these results. Mr. Gali will receive a summary of his genetic counseling visit and a copy of his results once available. This information will also be available in Epic.   Lastly, we encouraged Mr. Obrecht to remain in contact with cancer genetics annually so that we can continuously update the family history and inform him of any changes in cancer genetics and testing that may be of benefit for this family.   Mr. Truex questions were answered to his satisfaction today. Our contact information was provided should additional questions or concerns arise. Thank you for the referral and allowing [place] to share in the care of your patient.   Emmaus Brandi P. Florene Glen, [place], [facility] Licensed, Insurance risk surveyor [email or web address] phone: [phone]  The patient was seen for a total of 45 minutes in face-to-face genetic counseling.  The patient  was seen alone.  This patient was discussed with Drs. Magrinat, Lindi Adie and/or Burr Medico who agrees with the above.    _______________________________________________________________________ For Office Staff:  Number of people involved in session: 1 Was an Intern/ student involved with case: no

## 2021-06-23 ENCOUNTER — Telehealth: Payer: Self-pay | Admitting: Oncology

## 2021-06-23 NOTE — Telephone Encounter (Signed)
Scheduled appt per 12/23 referral. Pt was scheduled for Genetics by mistake, pt needed to be scheduled with Dr. Alen Blew. Pt is aware of appt with Dr. Alen Blew scheduled today. He is aware to arrive 15 mins prior to appt time.

## 2021-07-01 ENCOUNTER — Encounter: Payer: Self-pay | Admitting: Genetic Counselor

## 2021-07-01 ENCOUNTER — Telehealth: Payer: Self-pay | Admitting: Genetic Counselor

## 2021-07-01 ENCOUNTER — Ambulatory Visit: Payer: Self-pay | Admitting: Genetic Counselor

## 2021-07-01 DIAGNOSIS — Z1379 Encounter for other screening for genetic and chromosomal anomalies: Secondary | ICD-10-CM | POA: Insufficient documentation

## 2021-07-01 NOTE — Telephone Encounter (Signed)
Revealed negative genetic testing.  Discussed that we do not know why he has ureter and previously diagnosed with prostate cancer or why there is cancer in the family. It could be due to a different gene that we are not testing, or maybe our current technology may not be able to pick something up.  It will be important for him to keep in contact with genetics to keep up with whether additional testing may be needed.

## 2021-07-01 NOTE — Progress Notes (Signed)
HPI:  Kevin Morgan was previously seen in the [place] clinic due to a personal and family history of cancer and concerns regarding a hereditary predisposition to cancer. Please refer to our prior cancer genetics clinic note for more information regarding our discussion, assessment and recommendations, at the time. Kevin Morgan recent genetic test results were disclosed to him, as were recommendations warranted by these results. These results and recommendations are discussed in more detail below.  CANCER HISTORY:  Oncology History  Ureter cancer ([place])  06/16/2021 Initial Diagnosis   Ureter cancer ([place])   06/29/2021 Genetic Testing   Negative genetic testing on the CancerNext-Expanded+RNAinsight panel.  The report date is June 29, 2021.  The CancerNext-Expanded gene panel offered by [facility] and includes sequencing and rearrangement analysis for the following 77 genes: AIP, ALK, APC*, ATM*, AXIN2, BAP1, BARD1, BLM, BMPR1A, BRCA1*, BRCA2*, BRIP1*, CDC73, CDH1*, CDK4, CDKN1B, CDKN2A, CHEK2*, CTNNA1, DICER1, FANCC, FH, FLCN, GALNT12, KIF1B, LZTR1, MAX, MEN1, MET, MLH1*, MSH2*, MSH3, MSH6*, MUTYH*, NBN, NF1*, NF2, NTHL1, PALB2*, PHOX2B, PMS2*, POT1, PRKAR1A, PTCH1, PTEN*, RAD51C*, RAD51D*, RB1, RECQL, RET, SDHA, SDHAF2, SDHB, SDHC, SDHD, SMAD4, SMARCA4, SMARCB1, SMARCE1, STK11, SUFU, TMEM127, TP53*, TSC1, TSC2, VHL and XRCC2 (sequencing and deletion/duplication); EGFR, EGLN1, HOXB13, KIT, MITF, PDGFRA, POLD1, and POLE (sequencing only); EPCAM and GREM1 (deletion/duplication only). DNA and RNA analyses performed for * genes.      FAMILY HISTORY:  We obtained a detailed, 4-generation family history.  Significant diagnoses are listed below: Family History  Problem Relation Age of Onset   Prostate cancer Father    Breast cancer Sister    Lung cancer Brother    Prostate cancer Maternal Uncle    Prostate cancer Maternal Uncle    Heart Problems Maternal Grandmother    Heart  Problems Maternal Grandfather    Heart Problems Paternal Grandmother    Heart Problems Paternal Grandfather    Prostate cancer Nephew 33    The patient has one son who is cancer free.  He has two brothers and three sisters.  One sister had breast cancer in her 38's., and one brother has a son who has prostate cancer.  Both parents are deceased.   The patient's father had prostate cancer. He had many siblings who were cancer free. His parents died of heart disease.   The patient's mother did not have cancer.  She had two brothers with prostate cancer.  There is no other reported cancer on her side of the family.   Kevin Morgan is unaware of previous family history of genetic testing for hereditary cancer risks. Patient's maternal ancestors are of African American descent, and paternal ancestors are of African American descent. There is no reported Ashkenazi Jewish ancestry. There is no known consanguinity.  GENETIC TEST RESULTS: Genetic testing reported out on June 29, 2021 through the CancerNext-Expanded+RNAinsight cancer panel found no pathogenic mutations. The CancerNext-Expanded gene panel offered by [facility] and includes sequencing and rearrangement analysis for the following 77 genes: AIP, ALK, APC*, ATM*, AXIN2, BAP1, BARD1, BLM, BMPR1A, BRCA1*, BRCA2*, BRIP1*, CDC73, CDH1*, CDK4, CDKN1B, CDKN2A, CHEK2*, CTNNA1, DICER1, FANCC, FH, FLCN, GALNT12, KIF1B, LZTR1, MAX, MEN1, MET, MLH1*, MSH2*, MSH3, MSH6*, MUTYH*, NBN, NF1*, NF2, NTHL1, PALB2*, PHOX2B, PMS2*, POT1, PRKAR1A, PTCH1, PTEN*, RAD51C*, RAD51D*, RB1, RECQL, RET, SDHA, SDHAF2, SDHB, SDHC, SDHD, SMAD4, SMARCA4, SMARCB1, SMARCE1, STK11, SUFU, TMEM127, TP53*, TSC1, TSC2, VHL and XRCC2 (sequencing and deletion/duplication); EGFR, EGLN1, HOXB13, KIT, MITF, PDGFRA, POLD1, and POLE (sequencing only); EPCAM and GREM1 (deletion/duplication only). DNA  and RNA analyses performed for * genes. The test report has been scanned into EPIC and is  located under the Molecular Pathology section of the Results Review tab.  A portion of the result report is included below for reference.     We discussed with Kevin Morgan that because current genetic testing is not perfect, it is possible there may be a gene mutation in one of these genes that current testing cannot detect, but that chance is small.  We also discussed, that there could be another gene that has not yet been discovered, or that we have not yet tested, that is responsible for the cancer diagnoses in the family. It is also possible there is a hereditary cause for the cancer in the family that Kevin Morgan did not inherit and therefore was not identified in his testing.  Therefore, it is important to remain in touch with cancer genetics in the future so that we can continue to offer Kevin Morgan the most up to date genetic testing.   ADDITIONAL GENETIC TESTING: We discussed with Kevin Morgan that his genetic testing was fairly extensive.  If there are genes identified to increase cancer risk that can be analyzed in the future, we would be happy to discuss and coordinate this testing at that time.    CANCER SCREENING RECOMMENDATIONS: Kevin Morgan test result is considered negative (normal).  This means that we have not identified a hereditary cause for his personal and family history of cancer at this time. Most cancers happen by chance and this negative test suggests that his cancer may fall into this category.    While reassuring, this does not definitively rule out a hereditary predisposition to cancer. It is still possible that there could be genetic mutations that are undetectable by current technology. There could be genetic mutations in genes that have not been tested or identified to increase cancer risk.  Therefore, it is recommended he continue to follow the cancer management and screening guidelines provided by his oncology and primary healthcare provider.   An individual's cancer risk and  medical management are not determined by genetic test results alone. Overall cancer risk assessment incorporates additional factors, including personal medical history, family history, and any available genetic information that may result in a personalized plan for cancer prevention and surveillance  RECOMMENDATIONS FOR FAMILY MEMBERS:  Individuals in this family might be at some increased risk of developing cancer, over the general population risk, simply due to the family history of cancer.  We recommended women in this family have a yearly mammogram beginning at age 26, or 67 years younger than the earliest onset of cancer, an annual clinical breast exam, and perform monthly breast self-exams. Women in this family should also have a gynecological exam as recommended by their primary provider. All family members should be referred for colonoscopy starting at age 44.  FOLLOW-UP: Lastly, we discussed with Kevin Morgan that cancer genetics is a rapidly advancing field and it is possible that new genetic tests will be appropriate for him and/or his family members in the future. We encouraged him to remain in contact with cancer genetics on an annual basis so we can update his personal and family histories and let him know of advances in cancer genetics that may benefit this family.   Our contact number was provided. Kevin Morgan questions were answered to his satisfaction, and he knows he is welcome to call us at anytime with additional questions or concerns.   Roma Kayser,  MS, [place] Licensed, Insurance risk surveyor [email or web address]

## 2021-07-07 ENCOUNTER — Inpatient Hospital Stay: Payer: Medicare PPO | Admitting: Oncology

## 2021-07-13 ENCOUNTER — Telehealth: Payer: Self-pay | Admitting: Oncology

## 2021-07-13 NOTE — Telephone Encounter (Signed)
R/s pt's cancelled new pt appt with Dr. Alen Blew. Pt is aware of new appt date and time. Pt is aware to arrive 15 mins prior to appt time.

## 2021-07-27 ENCOUNTER — Inpatient Hospital Stay: Payer: Medicare PPO | Attending: Genetic Counselor | Admitting: Oncology

## 2021-07-27 ENCOUNTER — Other Ambulatory Visit: Payer: Self-pay

## 2021-07-27 VITALS — BP 130/61 | HR 90 | Temp 98.1°F | Resp 19 | Ht 67.0 in | Wt 265.6 lb

## 2021-07-27 DIAGNOSIS — C669 Malignant neoplasm of unspecified ureter: Secondary | ICD-10-CM

## 2021-07-27 DIAGNOSIS — Z803 Family history of malignant neoplasm of breast: Secondary | ICD-10-CM

## 2021-07-27 DIAGNOSIS — E119 Type 2 diabetes mellitus without complications: Secondary | ICD-10-CM | POA: Diagnosis not present

## 2021-07-27 DIAGNOSIS — I10 Essential (primary) hypertension: Secondary | ICD-10-CM

## 2021-07-27 DIAGNOSIS — Z8042 Family history of malignant neoplasm of prostate: Secondary | ICD-10-CM

## 2021-07-27 DIAGNOSIS — C799 Secondary malignant neoplasm of unspecified site: Secondary | ICD-10-CM

## 2021-07-27 DIAGNOSIS — Z87891 Personal history of nicotine dependence: Secondary | ICD-10-CM

## 2021-07-27 DIAGNOSIS — Z8546 Personal history of malignant neoplasm of prostate: Secondary | ICD-10-CM

## 2021-07-27 MED ORDER — PROCHLORPERAZINE MALEATE 10 MG PO TABS: 10.0000 mg | ORAL_TABLET | Freq: Four times a day (QID) | ORAL | 0 refills | Status: DC | PRN

## 2021-07-27 MED ORDER — LIDOCAINE-PRILOCAINE 2.5-2.5 % EX CREA: 1.0000 "application " | TOPICAL_CREAM | CUTANEOUS | 0 refills | Status: DC | PRN

## 2021-07-27 NOTE — Progress Notes (Signed)
START ON PATHWAY REGIMEN - Bladder     A cycle is every 21 days:     Carboplatin      Gemcitabine   **Always confirm dose/schedule in your pharmacy ordering system**  Patient Characteristics: Advanced/Metastatic Disease, First Line, No Prior Platinum-Based Therapy, Poor Renal Function (CrCl < 50 mL/min), Unknown PD-L1 Expression Therapeutic Status: Advanced/Metastatic Disease Line of Therapy: First Line Prior Platinum-Based Therapy<= No Renal Function: Poor Renal Function (CrCl < 50 mL/min) PD-L1 Expression Status: Unknown PD-L1 Expression Intent of Therapy: Non-Curative / Palliative Intent, Discussed with Patient 

## 2021-07-27 NOTE — Progress Notes (Signed)
Reason for the request: Urethral cancer  HPI: I was asked by Dr. Junious Silk to evaluate Kevin Morgan for evaluation of urethral malignancy.  He is a 71 year old man with history of prostate cancer and received brachytherapy in the past and developed urethral stricture.  He developed microscopic hematuria and felt a pop associated with urination.  He underwent cystoscopy as well as urethral stricture dilation completed on April 06, 2021.  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  Based on these findings he underwent PET scan on May 26, 2021.  The PET scan showed enlarged hypermetabolic left pelvic lymph nodes are highly suspicious for malignancy.  There is also mildly enlarged right common iliac lymph nodes of bilateral inferior pelvic lymph nodes.  Based on these findings was asked to comment about treatment choices.  Clinically, he reports no major complaints at this time.  He denies any nausea, vomiting or abdominal pain.  He denies any weight loss or appetite changes.  He denies any hospitalizations or illnesses.  He does have some urination difficulties including urinary frequency and incomplete emptying.  He does not report any headaches, blurry vision, syncope or seizures. Does not report any fevers, chills or sweats.  Does not report any cough, wheezing or hemoptysis.  Does not report any chest pain, palpitation, orthopnea or leg edema.  Does not report any nausea, vomiting or abdominal pain.  Does not report any constipation or diarrhea.  Does not report any skeletal complaints.    Does not report frequency, urgency or hematuria.  Does not report any skin rashes or lesions. Does not report any heat or cold intolerance.  Does not report any lymphadenopathy or petechiae.  Does not report any anxiety or depression.  Remaining review of systems is negative.     Past Medical History:  Diagnosis Date   Anal fissure     Anemia    Anesthesia complication 15/83/0940   Patient states that with general anesthesia he has difficulty  waking up and has SOB.   Colon polyps 7680/8811   Complication of anesthesia    Depression    DM type 2    ED (erectile dysfunction)    Family history of breast cancer    Family history of prostate cancer    Glaucoma    bilateral, mild   Hearing loss    Hyperlipidemia    Hypertension    Personal history of prostate cancer    Prostate cancer ([place]) 06/13/2008   seed implants   Proteinuria    Sleep apnea, obstructive    cpap used   Tachycardia    Ureter cancer ([place])    Urticaria   :   Past Surgical History:  Procedure Laterality Date   APPENDECTOMY  05/1996   mesh used- ruptured  appendix   COLONOSCOPY WITH PROPOFOL N/A 09/23/2014   Procedure: COLONOSCOPY WITH PROPOFOL;  Surgeon: [name], MD;  Location: [facility];  Service: Endoscopy;  Laterality: N/A;   CYSTOSCOPY WITH URETHRAL DILATATION N/A 04/06/2021   Procedure: CYSTOSCOPY WITH URETHRAL DILATATION; URETHRAL BIOPSY; DORSAL INCISION; RETROGRADE URETHRAGRAM;  Surgeon: Festus Aloe, MD;  Location: [facility];  Service: Urology;  Laterality: N/A;   INCISIONAL HERNIA REPAIR  05/1996   s/p appendectomy-open   INSERTION PROSTATE RADIATION SEED     >10 yrs ago as of 04/01/2021  :   Current Outpatient Medications:    acetaminophen (TYLENOL) 650 MG CR tablet, Take 650 mg by mouth every  8 (eight) hours as needed for pain., Disp: , Rfl:    aspirin 81 MG tablet, Take 81 mg by mouth as needed (Patient hasn't taken in a month as of 04/01/21.)., Disp: , Rfl:    cephALEXin (KEFLEX) 500 MG capsule, Take 500 mg by mouth at bedtime., Disp: , Rfl:    Cholecalciferol (VITAMIN D3) 1.25 MG (50000 UT) CAPS, Take by mouth daily., Disp: , Rfl:    cyanocobalamin 1000 MCG tablet, Take 100 mcg by mouth daily., Disp: , Rfl:    diltiazem (CARDIZEM CD) 240 MG 24 hr capsule, TAKE ONE CAPSULE BY MOUTH EVERY DAY  (Patient taking differently: 240 mg at bedtime.), Disp: 90 capsule, Rfl: 3   famotidine (PEPCID) 20 MG tablet, Take 20 mg by mouth daily., Disp: , Rfl:    fexofenadine (ALLEGRA) 180 MG tablet, Take 180 mg by mouth daily., Disp: , Rfl:    losartan (COZAAR) 50 MG tablet, Take 50 mg by mouth daily., Disp: , Rfl:    metFORMIN (GLUCOPHAGE) 500 MG tablet, Take 1,000 mg by mouth 2 (two) times daily. , Disp: , Rfl:    nitrofurantoin, macrocrystal-monohydrate, (MACROBID) 100 MG capsule, Take 1 capsule (100 mg total) by mouth at bedtime., Disp: 10 capsule, Rfl: 0   simvastatin (ZOCOR) 20 MG tablet, Take 20 mg by mouth every evening., Disp: , Rfl:    sodium bicarbonate 650 MG tablet, Take 650 mg by mouth 2 (two) times daily., Disp: , Rfl:    sodium chloride (OCEAN) 0.65 % SOLN nasal spray, Place 1 spray into both nostrils as needed for congestion., Disp: , Rfl:    tamsulosin (FLOMAX) 0.4 MG CAPS capsule, Take 0.4 mg by mouth at bedtime., Disp: , Rfl: 11:   Allergies  Allergen Reactions   Cinnamon Other (See Comments)    Lips and eye swelling.    Hydrocodone-Acetaminophen Swelling   Naprosyn [Naproxen] Hives  :   Family History  Problem Relation Age of Onset   Prostate cancer Father    Breast cancer Sister    Lung cancer Brother    Prostate cancer Maternal Uncle    Prostate cancer Maternal Uncle    Heart Problems Maternal Grandmother    Heart Problems Maternal Grandfather    Heart Problems Paternal Grandmother    Heart Problems Paternal Grandfather    Prostate cancer Nephew 53  :   Social History   Socioeconomic History   Marital status: Divorced    Spouse name: Not on file   Number of children: Not on file   Years of education: Not on file   Highest education level: Not on file  Occupational History   Not on file  Tobacco Use   Smoking status: Former    Types: Cigarettes    Quit date: 09/07/1968    Years since quitting: 52.9   Smokeless tobacco: Never  Vaping Use   Vaping  Use: Never used  Substance and Sexual Activity   Alcohol use: No   Drug use: No   Sexual activity: Not on file  Other Topics Concern   Not on file  Social History Narrative   Not on file   Social Determinants of Health   Financial Resource Strain: Not on file  Food Insecurity: Not on file  Transportation Needs: Not on file  Physical Activity: Not on file  Stress: Not on file  Social Connections: Not on file  Intimate Partner Violence: Not on file  :  Pertinent items are noted in HPI.  Exam: Blood  pressure 130/61, pulse 90, temperature 98.1 F (36.7 C), resp. rate 19, height 5\' 7"  (1.702 m), weight 265 lb 9.6 oz (120.5 kg), SpO2 100 %.  General appearance: alert and cooperative appeared without distress. Head: atraumatic without any abnormalities. Eyes: conjunctivae/corneas clear. PERRL.  Sclera anicteric. Throat: lips, mucosa, and tongue normal; without oral thrush or ulcers. Resp: clear to auscultation bilaterally without rhonchi, wheezes or dullness to percussion. Cardio: regular rate and rhythm, S1, S2 normal, no murmur, click, rub or gallop GI: soft, non-tender; bowel sounds normal; no masses,  no organomegaly Skin: Skin color, texture, turgor normal. No rashes or lesions Lymph nodes: Cervical, supraclavicular, and axillary nodes normal. Neurologic: Grossly normal without any motor, sensory or deep tendon reflexes. Musculoskeletal: No joint deformity or effusion.    Assessment and Plan:    71 year old with:  1.  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  Imaging studies for staging purposes included chest abdomen and pelvis that showed left iliac lymphadenopathy.  PET scan confirmed the presence of hypermetabolic activity consistent with metastatic disease.  Treatment options moving forward were discussed at this time.  Systemic chemotherapy remains his best option to offer palliation moving forward.   These options include platinum based chemotherapy, immunotherapy or combination of immunotherapy and antibody drug conjugate for platinum ineligible patients.  For the best response rate, platinum-based chemotherapy remains his best choice.  Complication associated with this treatment clued nausea, vomiting, mild suppression, neutropenia, anemia and worsening renal failure.  I anticipate given 3 cycles subsequently transition to immunotherapy.  Alternative treatment options would be supportive care only and possible transition to hospice if he experience further decline.    After discussion today, he is agreeable to proceed after chemo education class.  We will proceed with gemcitabine and carboplatin day 1 and will limited to the 8 chemotherapy due to risk of cytopenia and convenience of travel.     2.  IV access: Risks and benefits of using Port-A-Cath versus peripheral veins was discussed today.  Complication associated with Port-A-Cath insertion include bleeding, infection and thrombosis.  After discussing the risks and benefits,...   3.  Antiemetics: Prescription for Compazine was made available to him.   4.  Renal function surveillance: Baseline kidney function shows a creatinine of 1.7 and we will continue to monitor.   5.  Goals of care: Therapy is palliative at this time.   6.  Follow-up: will be in the immediate future to start chemotherapy.   60  minutes were dedicated to this visit. The time was spent on reviewing laboratory data, imaging studies, discussing treatment options, discussing prognosis and answering questions regarding future plan. This     A copy of this consult has been forwarded to the requesting physician.

## 2021-08-03 ENCOUNTER — Telehealth: Payer: Self-pay | Admitting: Oncology

## 2021-08-03 NOTE — Telephone Encounter (Signed)
.  Called patient to schedule appointment per 2/21 inbasket, patient is aware of date and time.   °

## 2021-08-03 NOTE — Progress Notes (Signed)
Pharmacist Chemotherapy Monitoring - Initial Assessment    Anticipated start date: 08/10/21   The following has been reviewed per standard work regarding the patient's treatment regimen: The patient's diagnosis, treatment plan and drug doses, and organ/hematologic function Lab orders and baseline tests specific to treatment regimen  The treatment plan start date, drug sequencing, and pre-medications Prior authorization status  Patient's documented medication list, including drug-drug interaction screen and prescriptions for anti-emetics and supportive care specific to the treatment regimen The drug concentrations, fluid compatibility, administration routes, and timing of the medications to be used The patient's access for treatment and lifetime cumulative dose history, if applicable  The patient's medication allergies and previous infusion related reactions, if applicable   Changes made to treatment plan:  N/A  Follow up needed:  Pending authorization for treatment    Philomena Course, RPH, 08/03/2021  1:44 PM

## 2021-08-05 ENCOUNTER — Other Ambulatory Visit: Payer: Self-pay

## 2021-08-05 ENCOUNTER — Inpatient Hospital Stay: Payer: Medicare PPO

## 2021-08-10 ENCOUNTER — Telehealth: Payer: Self-pay | Admitting: *Deleted

## 2021-08-10 ENCOUNTER — Ambulatory Visit: Payer: Medicare PPO

## 2021-08-10 ENCOUNTER — Other Ambulatory Visit: Payer: Medicare PPO

## 2021-08-10 ENCOUNTER — Other Ambulatory Visit: Payer: Self-pay | Admitting: Physician Assistant

## 2021-08-10 NOTE — Telephone Encounter (Signed)
Pt called to inform Dr. Alen Blew that his hemorrhoids are swollen.  Pt also wanted to know if he should have a urinary catheter inserted while pt is here for portacath insertion on 3/1 due to incontinence issue.  Stated he called Dr. Lyndal Rainbow office yesterday to report problems.  Instructed pt to notify his PCP of above issues.  Informed pt that he could also inform radiology nurse tomorrow for further evaluation of needing urinary catheter.  Pt voiced understanding.  Pt's  phone   [phone]

## 2021-08-11 ENCOUNTER — Other Ambulatory Visit: Payer: Self-pay | Admitting: Oncology

## 2021-08-11 ENCOUNTER — Ambulatory Visit (HOSPITAL_COMMUNITY)
Admission: RE | Admit: 2021-08-11 | Discharge: 2021-08-11 | Disposition: A | Discharge status: Home / Self Care | Payer: Medicare PPO | Source: Ambulatory Visit | Attending: Oncology | Admitting: Oncology

## 2021-08-11 ENCOUNTER — Encounter (HOSPITAL_COMMUNITY): Payer: Self-pay

## 2021-08-11 ENCOUNTER — Other Ambulatory Visit: Payer: Self-pay

## 2021-08-11 DIAGNOSIS — Z8546 Personal history of malignant neoplasm of prostate: Secondary | ICD-10-CM | POA: Diagnosis not present

## 2021-08-11 DIAGNOSIS — H409 Unspecified glaucoma: Secondary | ICD-10-CM | POA: Diagnosis not present

## 2021-08-11 DIAGNOSIS — G4733 Obstructive sleep apnea (adult) (pediatric): Secondary | ICD-10-CM | POA: Insufficient documentation

## 2021-08-11 DIAGNOSIS — H9190 Unspecified hearing loss, unspecified ear: Secondary | ICD-10-CM | POA: Insufficient documentation

## 2021-08-11 DIAGNOSIS — I10 Essential (primary) hypertension: Secondary | ICD-10-CM | POA: Insufficient documentation

## 2021-08-11 DIAGNOSIS — E119 Type 2 diabetes mellitus without complications: Secondary | ICD-10-CM | POA: Insufficient documentation

## 2021-08-11 DIAGNOSIS — Z7984 Long term (current) use of oral hypoglycemic drugs: Secondary | ICD-10-CM | POA: Insufficient documentation

## 2021-08-11 DIAGNOSIS — E785 Hyperlipidemia, unspecified: Secondary | ICD-10-CM | POA: Insufficient documentation

## 2021-08-11 DIAGNOSIS — C669 Malignant neoplasm of unspecified ureter: Secondary | ICD-10-CM | POA: Insufficient documentation

## 2021-08-11 DIAGNOSIS — Z87891 Personal history of nicotine dependence: Secondary | ICD-10-CM | POA: Diagnosis not present

## 2021-08-11 HISTORY — PX: IR IMAGING GUIDED PORT INSERTION: IMG5740

## 2021-08-11 LAB — GLUCOSE, CAPILLARY: Glucose-Capillary: 95 mg/dL (ref 70–99)

## 2021-08-11 MED ORDER — SODIUM CHLORIDE 0.9 % IV SOLN: INTRAVENOUS | Status: DC

## 2021-08-11 MED ORDER — MIDAZOLAM HCL 2 MG/2ML IJ SOLN
INTRAMUSCULAR | Status: AC
  Filled 2021-08-11: qty 4

## 2021-08-11 MED ORDER — FENTANYL CITRATE (PF) 100 MCG/2ML IJ SOLN
INTRAMUSCULAR | Status: AC | PRN
  Administered 2021-08-11 (×2): 50 ug via INTRAVENOUS

## 2021-08-11 MED ORDER — HEPARIN SOD (PORK) LOCK FLUSH 100 UNIT/ML IV SOLN
INTRAVENOUS | Status: AC | PRN
Start: 2021-08-11 — End: 2021-08-11
  Administered 2021-08-11: 500 [IU] via INTRAVENOUS

## 2021-08-11 MED ORDER — FENTANYL CITRATE (PF) 100 MCG/2ML IJ SOLN
INTRAMUSCULAR | Status: AC
  Filled 2021-08-11: qty 2

## 2021-08-11 MED ORDER — LIDOCAINE-EPINEPHRINE 1 %-1:100000 IJ SOLN
INTRAMUSCULAR | Status: AC
  Filled 2021-08-11: qty 1

## 2021-08-11 MED ORDER — HEPARIN SOD (PORK) LOCK FLUSH 100 UNIT/ML IV SOLN
INTRAVENOUS | Status: AC
  Filled 2021-08-11: qty 5

## 2021-08-11 MED ORDER — LIDOCAINE-EPINEPHRINE 1 %-1:100000 IJ SOLN
INTRAMUSCULAR | Status: AC | PRN
  Administered 2021-08-11: 20 mL

## 2021-08-11 MED ORDER — MIDAZOLAM HCL 2 MG/2ML IJ SOLN
INTRAMUSCULAR | Status: AC | PRN
  Administered 2021-08-11 (×3): 1 mg via INTRAVENOUS

## 2021-08-11 NOTE — Procedures (Signed)
Interventional Radiology Procedure Note ? ?Date of Procedure: 08/11/2021  ?Procedure: Port placement  ? ?Findings:  ?1. Successful placement of right chest port via right IJ   ? ?Complications: No immediate complications noted.  ? ?Estimated Blood Loss: minimal ? ?Follow-up and Recommendations: ?1. Bedrest 1 hour  ? ? ?Albin Felling, MD  ?Vascular & Interventional Radiology  ?08/11/2021 4:22 PM ? ? ? ?

## 2021-08-11 NOTE — Discharge Instructions (Signed)

## 2021-08-11 NOTE — Consult Note (Signed)
? ?Chief Complaint: ?Patient was seen in consultation today for Port-A-Cath placement ? ?Referring Physician(s): ?Zola Button N ? ?Supervising Physician: Juliet Rude ? ?Patient Status: [place] ? ?History of Present Illness: ?Kevin Morgan is a 71 y.o. male ,[employer], with past medical history of anal fissure, anemia, colon polyps, depression, diabetes, glaucoma, hearing loss, hyperlipidemia, hypertension, prostate cancer 2010 with prior brachytherapy, sleep apnea, and newly diagnosed urothelial carcinoma (prostatic urethra )with sarcomatoid component.  He has poor venous access and presents today for Port-A-Cath placement to assist with treatment. ? ?Past Medical History:  ?Diagnosis Date  ? Anal fissure   ? Anemia   ? Anesthesia complication 19/50/9326  ? Patient states that with general anesthesia he has difficulty  waking up and has SOB.  ? Colon polyps 2008/2011  ? Complication of anesthesia   ? Depression   ? DM type 2   ? ED (erectile dysfunction)   ? Family history of breast cancer   ? Family history of prostate cancer   ? Glaucoma   ? bilateral, mild  ? Hearing loss   ? Hyperlipidemia   ? Hypertension   ? Personal history of prostate cancer   ? Prostate cancer ([place]) 06/13/2008  ? seed implants  ? Proteinuria   ? Sleep apnea, obstructive   ? cpap used  ? Tachycardia   ? Ureter cancer ([place])   ? Urticaria   ? ? ?Past Surgical History:  ?Procedure Laterality Date  ? APPENDECTOMY  05/1996  ? mesh used- ruptured  appendix  ? COLONOSCOPY WITH PROPOFOL N/A 09/23/2014  ? Procedure: COLONOSCOPY WITH PROPOFOL;  Surgeon: [name], MD;  Location: [facility];  Service: Endoscopy;  Laterality: N/A;  ? CYSTOSCOPY WITH URETHRAL DILATATION N/A 04/06/2021  ? Procedure: CYSTOSCOPY WITH URETHRAL DILATATION; URETHRAL BIOPSY; DORSAL INCISION; RETROGRADE URETHRAGRAM;  Surgeon: Festus Aloe, MD;  Location: [facility];  Service: Urology;  Laterality: N/A;  ? [place]  05/1996  ? s/p appendectomy-open  ? INSERTION PROSTATE RADIATION SEED    ? >10 yrs ago as of 04/01/2021  ? ? ?Allergies: ?Cinnamon, Hydrocodone-acetaminophen, and Naprosyn [naproxen] ? ?Medications: ?Prior to Admission medications   ?Medication Sig Start Date End Date Taking? Authorizing Provider  ?acetaminophen (TYLENOL) 650 MG CR tablet Take 650 mg by mouth every 8 (eight) hours as needed for pain.   Yes [provider]  ?cephALEXin (KEFLEX) 500 MG capsule Take 500 mg by mouth at bedtime. 04/03/21  Yes [provider]  ?Cholecalciferol (VITAMIN D3) 1.25 MG (50000 UT) CAPS Take by mouth daily.   Yes [provider]  ?cyanocobalamin 1000 MCG tablet Take 100 mcg by mouth daily.   Yes [provider]  ?diltiazem (CARDIZEM CD) 240 MG 24 hr capsule TAKE ONE CAPSULE BY MOUTH EVERY DAY ?Patient taking differently: 240 mg at bedtime. 05/03/16  Yes Jerline Pain, MD  ?famotidine (PEPCID) 20 MG tablet Take 20 mg by mouth daily. 08/03/18  Yes [provider]  ?fexofenadine (ALLEGRA) 180 MG tablet Take 180 mg by mouth daily.   Yes [provider]  ?losartan (COZAAR) 50 MG tablet Take 50 mg by mouth daily.   Yes [provider]  ?metFORMIN (GLUCOPHAGE) 500 MG tablet Take 1,000 mg by mouth 2 (two) times daily.    Yes [provider]  ?nitrofurantoin, macrocrystal-monohydrate, (MACROBID) 100 MG capsule Take 1 capsule (100 mg total) by mouth at bedtime. 04/06/21  Yes Festus Aloe, MD  ?simvastatin (ZOCOR) 20  MG tablet Take 20 mg by mouth every evening. 09/28/18  Yes [provider]  ?sodium bicarbonate 650 MG tablet Take 650 mg by mouth 2 (two) times daily.   Yes [provider]  ?sodium chloride (OCEAN) 0.65 % SOLN nasal spray Place 1 spray into both nostrils as needed for congestion.   Yes [provider]  ?tamsulosin (FLOMAX) 0.4 MG CAPS capsule Take 0.4 mg by mouth at bedtime. 05/15/17  Yes [provider]   ?aspirin 81 MG tablet Take 81 mg by mouth as needed (Patient hasn't taken in a month as of 04/01/21.).    [provider]  ?lidocaine-prilocaine (EMLA) cream Apply 1 application topically as needed. 07/27/21   Wyatt Portela, MD  ?prochlorperazine (COMPAZINE) 10 MG tablet Take 1 tablet (10 mg total) by mouth every 6 (six) hours as needed for nausea or vomiting. 07/27/21   Wyatt Portela, MD  ?  ? ?Family History  ?Problem Relation Age of Onset  ? Prostate cancer Father   ? Breast cancer Sister   ? Lung cancer Brother   ? Prostate cancer Maternal Uncle   ? Prostate cancer Maternal Uncle   ? Heart Problems Maternal Grandmother   ? Heart Problems Maternal Grandfather   ? Heart Problems Paternal Grandmother   ? Heart Problems Paternal Grandfather   ? Prostate cancer Nephew 53  ? ? ?Social History  ? ?Socioeconomic History  ? Marital status: Divorced  ?  Spouse name: Not on file  ? Number of children: Not on file  ? Years of education: Not on file  ? Highest education level: Not on file  ?Occupational History  ? Not on file  ?Tobacco Use  ? Smoking status: Former  ?  Types: Cigarettes  ?  Quit date: 09/07/1968  ?  Years since quitting: 52.9  ? Smokeless tobacco: Never  ?Vaping Use  ? Vaping Use: Never used  ?Substance and Sexual Activity  ? Alcohol use: No  ? Drug use: No  ? Sexual activity: Not on file  ?Other Topics Concern  ? Not on file  ?Social History Narrative  ? Not on file  ? ?Social Determinants of Health  ? ?Financial Resource Strain: Not on file  ?Food Insecurity: Not on file  ?Transportation Needs: Not on file  ?Physical Activity: Not on file  ?Stress: Not on file  ?Social Connections: Not on file  ? ? ? ? ?Review of Systems currently denies fever, headache, chest pain, worsening dyspnea, cough, abdominal/back pain, nausea, vomiting.  Has had some occasional hemorrhoidal bleeding. ? ?Vital Signs: ?BP (!) (P) 160/73   Pulse (!) (P) 101   Temp (P) 98.3 ?F (36.8 ?C) (Oral)   Resp (P) 20   Ht 5\' 7"   (1.702 m)   Wt 265 lb 9.6 oz (120.5 kg)   SpO2 (P) 100%   BMI 41.60 kg/m?  ? ?Physical Exam awake, alert.  Chest clear to auscultation bilaterally.  Heart with sl tachcardic but regular rhythm.  Abdomen soft, positive bowel sounds, nontender.   ? ?Imaging: ?No results found. ? ?Labs: ? ?CBC: ?Recent Labs  ?  04/06/21 ?7564  ?HGB 9.9*  ?HCT 29.0*  ? ? ?COAGS: ?No results for input(s): INR, APTT in the last 8760 hours. ? ?BMP: ?Recent Labs  ?  04/06/21 ?3329  ?NA 138  ?K 4.6  ?CL 109  ?GLUCOSE 121*  ?BUN 13  ?CREATININE 1.70*  ? ? ?LIVER FUNCTION TESTS: ?No results for input(s): BILITOT, AST, ALT,  ALKPHOS, PROT, ALBUMIN in the last 8760 hours. ? ?TUMOR MARKERS: ?No results for input(s): AFPTM, CEA, CA199, CHROMGRNA in the last 8760 hours. ? ?Assessment and Plan: ?71 y.o. male ,Jehovah's Witness, with past medical history of anal fissure, anemia, colon polyps, depression, diabetes, glaucoma, hearing loss, hyperlipidemia, hypertension, prostate cancer 2010 with prior brachytherapy, sleep apnea, and newly diagnosed urothelial carcinoma (prostatic urethra )with sarcomatoid component.  He has poor venous access and presents today for Port-A-Cath placement to assist with treatment.Risks and benefits of image guided port-a-catheter placement was discussed with the patient including, but not limited to bleeding, infection, pneumothorax, or fibrin sheath development and need for additional procedures. ? ?All of the patient's questions were answered, patient is agreeable to proceed. ?Consent signed and in chart. ? ? ? ?Thank you for this interesting consult.  I greatly enjoyed meeting Kevin Morgan and look forward to participating in their care.  A copy of this report was sent to the requesting provider on this date. ? ?Electronically Signed: ?Autumn Messing, PA-C ?08/11/2021, 1:46 PM ? ? ?I spent a total of  25 minutes   in face to face in clinical consultation, greater than 50% of which was counseling/coordinating care  for Port-A-Cath placement ? ?

## 2021-08-12 LAB — NO BLOOD PRODUCTS

## 2021-08-14 ENCOUNTER — Encounter: Payer: Self-pay | Admitting: Oncology

## 2021-08-16 MED FILL — Dexamethasone Sodium Phosphate Inj 100 MG/10ML: INTRAMUSCULAR | Qty: 1 | Status: AC

## 2021-08-16 MED FILL — Fosaprepitant Dimeglumine For IV Infusion 150 MG (Base Eq): INTRAVENOUS | Qty: 5 | Status: AC

## 2021-08-17 ENCOUNTER — Other Ambulatory Visit: Payer: Self-pay | Admitting: Oncology

## 2021-08-17 ENCOUNTER — Telehealth: Payer: Self-pay | Admitting: *Deleted

## 2021-08-17 ENCOUNTER — Inpatient Hospital Stay: Payer: Medicare PPO | Attending: Genetic Counselor

## 2021-08-17 ENCOUNTER — Other Ambulatory Visit: Payer: Self-pay

## 2021-08-17 ENCOUNTER — Inpatient Hospital Stay: Payer: Medicare PPO

## 2021-08-17 VITALS — BP 164/73 | HR 97 | Temp 97.7°F | Resp 18 | Wt 269.2 lb

## 2021-08-17 DIAGNOSIS — C669 Malignant neoplasm of unspecified ureter: Secondary | ICD-10-CM

## 2021-08-17 DIAGNOSIS — Z5111 Encounter for antineoplastic chemotherapy: Secondary | ICD-10-CM | POA: Diagnosis not present

## 2021-08-17 DIAGNOSIS — C680 Malignant neoplasm of urethra: Secondary | ICD-10-CM | POA: Insufficient documentation

## 2021-08-17 LAB — CBC WITH DIFFERENTIAL (CANCER CENTER ONLY)
Abs Immature Granulocytes: 0.06 10*3/uL (ref 0.00–0.07)
Basophils Absolute: 0.1 10*3/uL (ref 0.0–0.1)
Basophils Relative: 1 %
Eosinophils Absolute: 0.6 10*3/uL — ABNORMAL HIGH (ref 0.0–0.5)
Eosinophils Relative: 6 %
HCT: 24.1 % — ABNORMAL LOW (ref 39.0–52.0)
Hemoglobin: 7.4 g/dL — ABNORMAL LOW (ref 13.0–17.0)
Immature Granulocytes: 1 %
Lymphocytes Relative: 23 %
Lymphs Abs: 2.5 10*3/uL (ref 0.7–4.0)
MCH: 20.4 pg — ABNORMAL LOW (ref 26.0–34.0)
MCHC: 30.7 g/dL (ref 30.0–36.0)
MCV: 66.6 fL — ABNORMAL LOW (ref 80.0–100.0)
Monocytes Absolute: 1 10*3/uL (ref 0.1–1.0)
Monocytes Relative: 9 %
Neutro Abs: 6.8 10*3/uL (ref 1.7–7.7)
Neutrophils Relative %: 60 %
Platelet Count: 335 10*3/uL (ref 150–400)
RBC: 3.62 MIL/uL — ABNORMAL LOW (ref 4.22–5.81)
RDW: 19.3 % — ABNORMAL HIGH (ref 11.5–15.5)
WBC Count: 11.1 10*3/uL — ABNORMAL HIGH (ref 4.0–10.5)
nRBC: 0 % (ref 0.0–0.2)

## 2021-08-17 LAB — CMP (CANCER CENTER ONLY)
ALT: 10 U/L (ref 0–44)
AST: 8 U/L — ABNORMAL LOW (ref 15–41)
Albumin: 3.6 g/dL (ref 3.5–5.0)
Alkaline Phosphatase: 70 U/L (ref 38–126)
Anion gap: 6 (ref 5–15)
BUN: 37 mg/dL — ABNORMAL HIGH (ref 8–23)
CO2: 14 mmol/L — ABNORMAL LOW (ref 22–32)
Calcium: 9.7 mg/dL (ref 8.9–10.3)
Chloride: 115 mmol/L — ABNORMAL HIGH (ref 98–111)
Creatinine: 5.94 mg/dL (ref 0.61–1.24)
GFR, Estimated: 9 mL/min — ABNORMAL LOW (ref >60)
Glucose, Bld: 106 mg/dL — ABNORMAL HIGH (ref 70–99)
Potassium: 5.3 mmol/L — ABNORMAL HIGH (ref 3.5–5.1)
Sodium: 135 mmol/L (ref 135–145)
Total Bilirubin: 0.3 mg/dL (ref 0.3–1.2)
Total Protein: 7.4 g/dL (ref 6.5–8.1)

## 2021-08-17 MED ORDER — SODIUM CHLORIDE 0.9 % IV SOLN
10.0000 mg | Freq: Once | INTRAVENOUS | Status: DC
  Filled 2021-08-17: qty 1

## 2021-08-17 MED ORDER — SODIUM CHLORIDE 0.9 % IV SOLN: Freq: Once | INTRAVENOUS | Status: AC

## 2021-08-17 MED ORDER — SODIUM CHLORIDE 0.9 % IV SOLN
150.0000 mg | Freq: Once | INTRAVENOUS | Status: DC
  Filled 2021-08-17: qty 5

## 2021-08-17 MED ORDER — PALONOSETRON HCL INJECTION 0.25 MG/5ML: 0.2500 mg | Freq: Once | INTRAVENOUS | Status: DC

## 2021-08-17 MED ORDER — SODIUM CHLORIDE 0.9 % IV SOLN
1000.0000 mg/m2 | Freq: Once | INTRAVENOUS | Status: AC
  Administered 2021-08-17: 2394 mg via INTRAVENOUS
  Filled 2021-08-17: qty 62.96

## 2021-08-17 MED ORDER — PROCHLORPERAZINE MALEATE 10 MG PO TABS
10.0000 mg | ORAL_TABLET | Freq: Once | ORAL | Status: AC
  Administered 2021-08-17: 10 mg via ORAL
  Filled 2021-08-17: qty 1

## 2021-08-17 MED ORDER — SODIUM CHLORIDE 0.9% FLUSH
10.0000 mL | INTRAVENOUS | Status: AC | PRN
  Administered 2021-08-17: 10 mL

## 2021-08-17 NOTE — Progress Notes (Signed)
Per Dr. Alen Blew, will proceed with only Gemar today, no carbo as his labs were abnormal. Hbg 7.4 g/dL, K+ 5.3 mmol/L and Creatine 5.94 mg/dL ?

## 2021-08-17 NOTE — Telephone Encounter (Signed)
CRITICAL VALUE STICKER ? ?CRITICAL VALUE: Creatinine 5.94 ? ?RECEIVER (on-site recipient of call): Sharlynn Oliphant, RN ? ?[place] NOTIFIED: 1320 ? ?MESSENGER (representative from lab): ? ?MD NOTIFIED: Dr.Shadad ? ?TIME OF NOTIFICATION: 8413 ? ?RESPONSE: Noted ? ? ?

## 2021-08-17 NOTE — Patient Instructions (Addendum)
[facility]  Discharge Instructions: Thank you for choosing [facility] to provide your oncology and hematology care.   If you have a lab appointment with the [facility], please go directly to the [facility] and check in at the registration area.   Wear comfortable clothing and clothing appropriate for easy access to any Portacath or PICC line.   We strive to give you quality time with your provider. You may need to reschedule your appointment if you arrive late (15 or more minutes).  Arriving late affects you and other patients whose appointments are after yours.  Also, if you miss three or more appointments without notifying the office, you may be dismissed from the clinic at the providers discretion.      For prescription refill requests, have your pharmacy contact our office and allow 72 hours for refills to be completed.    Today you received the following chemotherapy and/or immunotherapy agents: Gemcitabine (Gemzar).   To help prevent nausea and vomiting after your treatment, we encourage you to take your nausea medication as directed.  BELOW ARE SYMPTOMS THAT SHOULD BE REPORTED IMMEDIATELY: *FEVER GREATER THAN 100.4 F (38 C) OR HIGHER *CHILLS OR SWEATING *NAUSEA AND VOMITING THAT IS NOT CONTROLLED WITH YOUR NAUSEA MEDICATION *UNUSUAL SHORTNESS OF BREATH *UNUSUAL BRUISING OR BLEEDING *URINARY PROBLEMS (pain or burning when urinating, or frequent urination) *BOWEL PROBLEMS (unusual diarrhea, constipation, pain near the anus) TENDERNESS IN MOUTH AND THROAT WITH OR WITHOUT PRESENCE OF ULCERS (sore throat, sores in mouth, or a toothache) UNUSUAL RASH, SWELLING OR PAIN  UNUSUAL VAGINAL DISCHARGE OR ITCHING   Items with * indicate a potential emergency and should be followed up as soon as possible or go to the Emergency Department if any problems should occur.  Please show the CHEMOTHERAPY ALERT CARD or IMMUNOTHERAPY ALERT CARD at  check-in to the Emergency Department and triage nurse.  Should you have questions after your visit or need to cancel or reschedule your appointment, please contact [facility]  Dept: [phone]  and follow the prompts.  Office hours are 8:00 a.m. to 4:30 p.m. Monday - Friday. Please note that voicemails left after 4:00 p.m. may not be returned until the following business day.  We are closed weekends and major holidays. You have access to a nurse at all times for urgent questions. Please call the main number to the clinic Dept: [phone] and follow the prompts.   For any non-urgent questions, you may also contact your provider using MyChart. We now offer e-Visits for anyone 21 and older to request care online for non-urgent symptoms. For details visit [email or web address].   Also download the MyChart app! Go to the app store, search "MyChart", open the app, select [facility], and log in with your MyChart username and password.  Due to Covid, a mask is required upon entering the hospital/clinic. If you do not have a mask, one will be given to you upon arrival. For doctor visits, patients may have 1 support person aged 49 or older with them. For treatment visits, patients cannot have anyone with them due to current Covid guidelines and our immunocompromised population.   Gemcitabine injection What is this medication? GEMCITABINE (jem SYE ta been) is a chemotherapy drug. This medicine is used to treat many types of cancer like breast cancer, lung cancer, pancreatic cancer, and ovarian cancer. This medicine may be used for other purposes; ask your health care provider or pharmacist if  you have questions. COMMON BRAND NAME(S): Gemzar, Infugem What should I tell my care team before I take this medication? They need to know if you have any of these conditions: blood disorders infection kidney disease liver disease lung or breathing disease, like asthma recent or  ongoing radiation therapy an unusual or allergic reaction to gemcitabine, other chemotherapy, other medicines, foods, dyes, or preservatives pregnant or trying to get pregnant breast-feeding How should I use this medication? This drug is given as an infusion into a vein. It is administered in a hospital or clinic by a specially trained health care professional. Talk to your pediatrician regarding the use of this medicine in children. Special care may be needed. Overdosage: If you think you have taken too much of this medicine contact a poison control center or emergency room at once. NOTE: This medicine is only for you. Do not share this medicine with others. What if I miss a dose? It is important not to miss your dose. Call your doctor or health care professional if you are unable to keep an appointment. What may interact with this medication? medicines to increase blood counts like filgrastim, pegfilgrastim, sargramostim some other chemotherapy drugs like cisplatin vaccines Talk to your doctor or health care professional before taking any of these medicines: acetaminophen aspirin ibuprofen ketoprofen naproxen This list may not describe all possible interactions. Give your health care provider a list of all the medicines, herbs, non-prescription drugs, or dietary supplements you use. Also tell them if you smoke, drink alcohol, or use illegal drugs. Some items may interact with your medicine. What should I watch for while using this medication? Visit your doctor for checks on your progress. This drug may make you feel generally unwell. This is not uncommon, as chemotherapy can affect healthy cells as well as cancer cells. Report any side effects. Continue your course of treatment even though you feel ill unless your doctor tells you to stop. In some cases, you may be given additional medicines to help with side effects. Follow all directions for their use. Call your doctor or health care  professional for advice if you get a fever, chills or sore throat, or other symptoms of a cold or flu. Do not treat yourself. This drug decreases your body's ability to fight infections. Try to avoid being around people who are sick. This medicine may increase your risk to bruise or bleed. Call your doctor or health care professional if you notice any unusual bleeding. Be careful brushing and flossing your teeth or using a toothpick because you may get an infection or bleed more easily. If you have any dental work done, tell your dentist you are receiving this medicine. Avoid taking products that contain aspirin, acetaminophen, ibuprofen, naproxen, or ketoprofen unless instructed by your doctor. These medicines may hide a fever. Do not become pregnant while taking this medicine or for 6 months after stopping it. Women should inform their doctor if they wish to become pregnant or think they might be pregnant. Men should not father a child while taking this medicine and for 3 months after stopping it. There is a potential for serious side effects to an unborn child. Talk to your health care professional or pharmacist for more information. Do not breast-feed an infant while taking this medicine or for at least 1 week after stopping it. Men should inform their doctors if they wish to father a child. This medicine may lower sperm counts. Talk with your doctor or health care professional if  you are concerned about your fertility. What side effects may I notice from receiving this medication? Side effects that you should report to your doctor or health care professional as soon as possible: allergic reactions like skin rash, itching or hives, swelling of the face, lips, or tongue breathing problems pain, redness, or irritation at site where injected signs and symptoms of a dangerous change in heartbeat or heart rhythm like chest pain; dizziness; fast or irregular heartbeat; palpitations; feeling faint or  lightheaded, falls; breathing problems signs of decreased platelets or bleeding - bruising, pinpoint red spots on the skin, black, tarry stools, blood in the urine signs of decreased red blood cells - unusually weak or tired, feeling faint or lightheaded, falls signs of infection - fever or chills, cough, sore throat, pain or difficulty passing urine signs and symptoms of kidney injury like trouble passing urine or change in the amount of urine signs and symptoms of liver injury like dark yellow or brown urine; general ill feeling or flu-like symptoms; light-colored stools; loss of appetite; nausea; right upper belly pain; unusually weak or tired; yellowing of the eyes or skin swelling of ankles, feet, hands Side effects that usually do not require medical attention (report to your doctor or health care professional if they continue or are bothersome): constipation diarrhea hair loss loss of appetite nausea rash vomiting This list may not describe all possible side effects. Call your doctor for medical advice about side effects. You may report side effects to FDA at 1-800-FDA-1088. Where should I keep my medication? This drug is given in a hospital or clinic and will not be stored at home. NOTE: This sheet is a summary. It may not cover all possible information. If you have questions about this medicine, talk to your doctor, pharmacist, or health care provider.  2022 Elsevier/Gold Standard (2017-08-23 00:00:00).

## 2021-08-18 ENCOUNTER — Other Ambulatory Visit: Payer: Self-pay | Admitting: Oncology

## 2021-08-18 ENCOUNTER — Telehealth: Payer: Self-pay | Admitting: *Deleted

## 2021-08-18 NOTE — Telephone Encounter (Addendum)
Chemotherapy f/u callback: Pt was not available to f/u with 1st time Gemzar infusion on 08/17/2021. Advised pt via vm to call office if there were any concerns that needed to be discussed referring to chemotherapy side effects and any other questions.  ?

## 2021-08-18 NOTE — Progress Notes (Signed)
Labs from March 7 personally reviewed and discussed with the patient today.  His chemotherapy was was changed and received gemcitabine only because of his poor kidney function.  He is having a lot of difficulty urination and poor emptying of his bladder which likely resulting in obstructive uropathy symptoms and increase in his BUN and creatinine. ? ?He is scheduled to see Dr. Junious Silk on August 20, 2021 for evaluation of possible catheter insertion.  He will have repeat labs in 1 week.  If his kidney function continues to be poor I might switch his treatment to Pembrolizumab.  He understands and all his questions were answered. ?

## 2021-08-28 ENCOUNTER — Observation Stay (HOSPITAL_COMMUNITY)
Admission: AD | Admit: 2021-08-28 | Discharge: 2021-08-30 | Disposition: A | Discharge status: Home / Self Care | Payer: Medicare PPO | Source: Other Acute Inpatient Hospital | Attending: Internal Medicine | Admitting: Internal Medicine

## 2021-08-28 ENCOUNTER — Observation Stay (HOSPITAL_COMMUNITY): Payer: Medicare PPO

## 2021-08-28 ENCOUNTER — Other Ambulatory Visit: Payer: Self-pay

## 2021-08-28 DIAGNOSIS — C689 Malignant neoplasm of urinary organ, unspecified: Secondary | ICD-10-CM | POA: Diagnosis present

## 2021-08-28 DIAGNOSIS — R112 Nausea with vomiting, unspecified: Principal | ICD-10-CM | POA: Diagnosis present

## 2021-08-28 DIAGNOSIS — Z8042 Family history of malignant neoplasm of prostate: Secondary | ICD-10-CM | POA: Diagnosis not present

## 2021-08-28 DIAGNOSIS — I129 Hypertensive chronic kidney disease with stage 1 through stage 4 chronic kidney disease, or unspecified chronic kidney disease: Secondary | ICD-10-CM | POA: Insufficient documentation

## 2021-08-28 DIAGNOSIS — E119 Type 2 diabetes mellitus without complications: Secondary | ICD-10-CM | POA: Insufficient documentation

## 2021-08-28 DIAGNOSIS — E872 Acidosis, unspecified: Secondary | ICD-10-CM | POA: Diagnosis not present

## 2021-08-28 DIAGNOSIS — C669 Malignant neoplasm of unspecified ureter: Secondary | ICD-10-CM | POA: Diagnosis not present

## 2021-08-28 DIAGNOSIS — Z8546 Personal history of malignant neoplasm of prostate: Secondary | ICD-10-CM | POA: Diagnosis not present

## 2021-08-28 DIAGNOSIS — IMO0001 Reserved for inherently not codable concepts without codable children: Secondary | ICD-10-CM

## 2021-08-28 DIAGNOSIS — Z7984 Long term (current) use of oral hypoglycemic drugs: Secondary | ICD-10-CM | POA: Diagnosis not present

## 2021-08-28 DIAGNOSIS — D6481 Anemia due to antineoplastic chemotherapy: Secondary | ICD-10-CM | POA: Insufficient documentation

## 2021-08-28 DIAGNOSIS — N183 Chronic kidney disease, stage 3 unspecified: Secondary | ICD-10-CM | POA: Insufficient documentation

## 2021-08-28 DIAGNOSIS — D72829 Elevated white blood cell count, unspecified: Secondary | ICD-10-CM | POA: Diagnosis not present

## 2021-08-28 DIAGNOSIS — Z79899 Other long term (current) drug therapy: Secondary | ICD-10-CM | POA: Diagnosis not present

## 2021-08-28 DIAGNOSIS — Z531 Procedure and treatment not carried out because of patient's decision for reasons of belief and group pressure: Secondary | ICD-10-CM

## 2021-08-28 DIAGNOSIS — N184 Chronic kidney disease, stage 4 (severe): Secondary | ICD-10-CM | POA: Insufficient documentation

## 2021-08-28 DIAGNOSIS — E1122 Type 2 diabetes mellitus with diabetic chronic kidney disease: Secondary | ICD-10-CM | POA: Diagnosis not present

## 2021-08-28 DIAGNOSIS — N139 Obstructive and reflux uropathy, unspecified: Secondary | ICD-10-CM

## 2021-08-28 DIAGNOSIS — Z794 Long term (current) use of insulin: Secondary | ICD-10-CM | POA: Insufficient documentation

## 2021-08-28 DIAGNOSIS — Z87891 Personal history of nicotine dependence: Secondary | ICD-10-CM | POA: Insufficient documentation

## 2021-08-28 DIAGNOSIS — D630 Anemia in neoplastic disease: Secondary | ICD-10-CM | POA: Diagnosis not present

## 2021-08-28 DIAGNOSIS — N179 Acute kidney failure, unspecified: Secondary | ICD-10-CM | POA: Insufficient documentation

## 2021-08-28 DIAGNOSIS — R197 Diarrhea, unspecified: Secondary | ICD-10-CM | POA: Diagnosis not present

## 2021-08-28 DIAGNOSIS — T451X5A Adverse effect of antineoplastic and immunosuppressive drugs, initial encounter: Secondary | ICD-10-CM | POA: Diagnosis not present

## 2021-08-28 LAB — CBC WITH DIFFERENTIAL/PLATELET
Abs Immature Granulocytes: 0.71 10*3/uL — ABNORMAL HIGH (ref 0.00–0.07)
Basophils Absolute: 0 10*3/uL (ref 0.0–0.1)
Basophils Relative: 0 %
Eosinophils Absolute: 0.2 10*3/uL (ref 0.0–0.5)
Eosinophils Relative: 2 %
HCT: 23.5 % — ABNORMAL LOW (ref 39.0–52.0)
Hemoglobin: 7 g/dL — ABNORMAL LOW (ref 13.0–17.0)
Immature Granulocytes: 6 %
Lymphocytes Relative: 21 %
Lymphs Abs: 2.6 10*3/uL (ref 0.7–4.0)
MCH: 20.1 pg — ABNORMAL LOW (ref 26.0–34.0)
MCHC: 29.8 g/dL — ABNORMAL LOW (ref 30.0–36.0)
MCV: 67.3 fL — ABNORMAL LOW (ref 80.0–100.0)
Monocytes Absolute: 0.9 10*3/uL (ref 0.1–1.0)
Monocytes Relative: 7 %
Neutro Abs: 7.9 10*3/uL — ABNORMAL HIGH (ref 1.7–7.7)
Neutrophils Relative %: 64 %
Platelets: 344 10*3/uL (ref 150–400)
RBC: 3.49 MIL/uL — ABNORMAL LOW (ref 4.22–5.81)
RDW: 19.7 % — ABNORMAL HIGH (ref 11.5–15.5)
WBC: 12.2 10*3/uL — ABNORMAL HIGH (ref 4.0–10.5)
nRBC: 0.7 % — ABNORMAL HIGH (ref 0.0–0.2)

## 2021-08-28 LAB — HEMOGLOBIN A1C
Hgb A1c MFr Bld: 7.1 % — ABNORMAL HIGH (ref 4.8–5.6)
Mean Plasma Glucose: 157.07 mg/dL

## 2021-08-28 LAB — URINALYSIS, ROUTINE W REFLEX MICROSCOPIC
Bilirubin Urine: NEGATIVE
Glucose, UA: NEGATIVE mg/dL
Ketones, ur: NEGATIVE mg/dL
Nitrite: NEGATIVE
Protein, ur: NEGATIVE mg/dL
Specific Gravity, Urine: 1.005 (ref 1.005–1.030)
pH: 5 (ref 5.0–8.0)

## 2021-08-28 LAB — COMPREHENSIVE METABOLIC PANEL
ALT: 64 U/L — ABNORMAL HIGH (ref 0–44)
AST: 36 U/L (ref 15–41)
Albumin: 2.7 g/dL — ABNORMAL LOW (ref 3.5–5.0)
Alkaline Phosphatase: 107 U/L (ref 38–126)
Anion gap: 6 (ref 5–15)
BUN: 20 mg/dL (ref 8–23)
CO2: 15 mmol/L — ABNORMAL LOW (ref 22–32)
Calcium: 8.4 mg/dL — ABNORMAL LOW (ref 8.9–10.3)
Chloride: 121 mmol/L — ABNORMAL HIGH (ref 98–111)
Creatinine, Ser: 2.95 mg/dL — ABNORMAL HIGH (ref 0.61–1.24)
GFR, Estimated: 22 mL/min — ABNORMAL LOW (ref >60)
Glucose, Bld: 130 mg/dL — ABNORMAL HIGH (ref 70–99)
Potassium: 4.3 mmol/L (ref 3.5–5.1)
Sodium: 142 mmol/L (ref 135–145)
Total Bilirubin: 0.2 mg/dL — ABNORMAL LOW (ref 0.3–1.2)
Total Protein: 7.2 g/dL (ref 6.5–8.1)

## 2021-08-28 LAB — C DIFFICILE QUICK SCREEN W PCR REFLEX
C Diff antigen: NEGATIVE
C Diff interpretation: NOT DETECTED
C Diff toxin: NEGATIVE

## 2021-08-28 LAB — RETICULOCYTES
Immature Retic Fract: 18.9 % — ABNORMAL HIGH (ref 2.3–15.9)
RBC.: 3.41 MIL/uL — ABNORMAL LOW (ref 4.22–5.81)
Retic Count, Absolute: 8.5 10*3/uL — ABNORMAL LOW (ref 19.0–186.0)
Retic Ct Pct: <0.4 % — ABNORMAL LOW (ref 0.4–3.1)

## 2021-08-28 LAB — IRON AND TIBC
Iron: 29 ug/dL — ABNORMAL LOW (ref 45–182)
Saturation Ratios: 17 % — ABNORMAL LOW (ref 17.9–39.5)
TIBC: 170 ug/dL — ABNORMAL LOW (ref 250–450)
UIBC: 141 ug/dL

## 2021-08-28 LAB — VITAMIN B12: Vitamin B-12: 1058 pg/mL — ABNORMAL HIGH (ref 180–914)

## 2021-08-28 LAB — GLUCOSE, CAPILLARY
Glucose-Capillary: 145 mg/dL — ABNORMAL HIGH (ref 70–99)
Glucose-Capillary: 89 mg/dL (ref 70–99)

## 2021-08-28 LAB — FERRITIN: Ferritin: 215 ng/mL (ref 24–336)

## 2021-08-28 LAB — PROCALCITONIN: Procalcitonin: 1.17 ng/mL

## 2021-08-28 LAB — FOLATE: Folate: 10.2 ng/mL (ref >5.9)

## 2021-08-28 MED ORDER — FAMOTIDINE 20 MG PO TABS
20.0000 mg | ORAL_TABLET | Freq: Every day | ORAL | Status: DC
  Administered 2021-08-28 – 2021-08-30 (×3): 20 mg via ORAL
  Filled 2021-08-28 (×3): qty 1

## 2021-08-28 MED ORDER — PIPERACILLIN-TAZOBACTAM 3.375 G IVPB
3.3750 g | Freq: Three times a day (TID) | INTRAVENOUS | Status: DC
Start: 2021-08-28 — End: 2021-08-29
  Administered 2021-08-28 – 2021-08-29 (×2): 3.375 g via INTRAVENOUS
  Filled 2021-08-28 (×2): qty 50

## 2021-08-28 MED ORDER — INSULIN ASPART 100 UNIT/ML IJ SOLN: 0.0000 [IU] | Freq: Every day | INTRAMUSCULAR | Status: DC

## 2021-08-28 MED ORDER — HYDROCORTISONE (PERIANAL) 2.5 % EX CREA
1.0000 "application " | TOPICAL_CREAM | Freq: Two times a day (BID) | CUTANEOUS | Status: DC
  Administered 2021-08-28 – 2021-08-30 (×4): 1 via TOPICAL
  Filled 2021-08-28: qty 28.35

## 2021-08-28 MED ORDER — ENOXAPARIN SODIUM 30 MG/0.3ML IJ SOSY
30.0000 mg | PREFILLED_SYRINGE | INTRAMUSCULAR | Status: DC
  Administered 2021-08-28: 30 mg via SUBCUTANEOUS
  Filled 2021-08-28: qty 0.3

## 2021-08-28 MED ORDER — ONDANSETRON HCL 4 MG/2ML IJ SOLN
4.0000 mg | Freq: Four times a day (QID) | INTRAMUSCULAR | Status: DC | PRN
  Administered 2021-08-28: 4 mg via INTRAVENOUS
  Filled 2021-08-28: qty 2

## 2021-08-28 MED ORDER — ONDANSETRON HCL 4 MG PO TABS: 4.0000 mg | ORAL_TABLET | Freq: Four times a day (QID) | ORAL | Status: DC | PRN

## 2021-08-28 MED ORDER — SODIUM CHLORIDE 0.9% FLUSH: 10.0000 mL | INTRAVENOUS | Status: DC | PRN

## 2021-08-28 MED ORDER — TAMSULOSIN HCL 0.4 MG PO CAPS
0.4000 mg | ORAL_CAPSULE | Freq: Every day | ORAL | Status: DC
  Administered 2021-08-28 – 2021-08-29 (×2): 0.4 mg via ORAL
  Filled 2021-08-28 (×2): qty 1

## 2021-08-28 MED ORDER — INSULIN ASPART 100 UNIT/ML IJ SOLN
0.0000 [IU] | Freq: Three times a day (TID) | INTRAMUSCULAR | Status: DC
  Administered 2021-08-28 – 2021-08-30 (×5): 2 [IU] via SUBCUTANEOUS

## 2021-08-28 MED ORDER — LACTATED RINGERS IV SOLN: INTRAVENOUS | Status: DC

## 2021-08-28 MED ORDER — SIMVASTATIN 10 MG PO TABS
20.0000 mg | ORAL_TABLET | Freq: Every day | ORAL | Status: DC
  Administered 2021-08-28 – 2021-08-29 (×2): 20 mg via ORAL
  Filled 2021-08-28 (×2): qty 2

## 2021-08-28 MED ORDER — ACETAMINOPHEN 650 MG RE SUPP: 650.0000 mg | Freq: Four times a day (QID) | RECTAL | Status: DC | PRN

## 2021-08-28 MED ORDER — LORATADINE 10 MG PO TABS
10.0000 mg | ORAL_TABLET | Freq: Every day | ORAL | Status: DC
  Administered 2021-08-29 – 2021-08-30 (×2): 10 mg via ORAL
  Filled 2021-08-28 (×2): qty 1

## 2021-08-28 MED ORDER — CHLORHEXIDINE GLUCONATE CLOTH 2 % EX PADS
6.0000 | MEDICATED_PAD | Freq: Every day | CUTANEOUS | Status: DC
  Administered 2021-08-28 – 2021-08-30 (×3): 6 via TOPICAL

## 2021-08-28 MED ORDER — ACETAMINOPHEN 325 MG PO TABS
650.0000 mg | ORAL_TABLET | Freq: Four times a day (QID) | ORAL | Status: DC | PRN
  Administered 2021-08-29: 650 mg via ORAL
  Filled 2021-08-28: qty 2

## 2021-08-28 MED ORDER — VITAMIN B-12 100 MCG PO TABS
100.0000 ug | ORAL_TABLET | Freq: Every day | ORAL | Status: DC
  Administered 2021-08-29 – 2021-08-30 (×2): 100 ug via ORAL
  Filled 2021-08-28 (×2): qty 1

## 2021-08-28 MED ORDER — SODIUM BICARBONATE 650 MG PO TABS
650.0000 mg | ORAL_TABLET | Freq: Three times a day (TID) | ORAL | Status: DC
Start: 2021-08-28 — End: 2021-08-30
  Administered 2021-08-28 – 2021-08-30 (×5): 650 mg via ORAL
  Filled 2021-08-28 (×5): qty 1

## 2021-08-28 MED ORDER — SODIUM CHLORIDE 0.9 % IV SOLN
INTRAVENOUS | Status: DC | PRN
Start: 2021-08-28 — End: 2021-08-30

## 2021-08-28 MED ORDER — OYSTER SHELL CALCIUM/D3 500-5 MG-MCG PO TABS
1.0000 | ORAL_TABLET | Freq: Every day | ORAL | Status: DC
  Administered 2021-08-29 – 2021-08-30 (×2): 1 via ORAL
  Filled 2021-08-28 (×2): qty 1

## 2021-08-28 MED ORDER — SODIUM BICARBONATE 650 MG PO TABS
650.0000 mg | ORAL_TABLET | Freq: Two times a day (BID) | ORAL | Status: DC
  Administered 2021-08-28: 650 mg via ORAL
  Filled 2021-08-28: qty 1

## 2021-08-28 NOTE — Assessment & Plan Note (Signed)
Hemoglobin at 7.  No obvious bleeding. ?I tried ordering blood transfusion but system is giving me alerts that patient has refused transfusion in the past, unable to locate any note. ?-Type and screen ?-Monitor hemoglobin ?-Please discussed with patient if hemoglobin falls below 7. ?-Anemia panel ?

## 2021-08-28 NOTE — Assessment & Plan Note (Addendum)
Being managed by oncology.  Last chemo was 10 days ago. ?-Continue with outpatient oncology follow-up ?

## 2021-08-28 NOTE — Progress Notes (Signed)
PHARMACY NOTE -  Zosyn ? ?Pharmacy has been assisting with dosing of Zosyn for intra-abdominal infection. ?Dosage remains stable at 3.375 g IV q8 hr and further renal adjustments per institutional Pharmacy antibiotic protocol ? ?Pharmacy will sign off, following peripherally for culture results or dose adjustments. Please reconsult if a change in clinical status warrants re-evaluation of dosage. ? ?Reuel Boom, PharmD, BCPS ?[phone] ?08/28/2021, 7:03 PM ? ? ? ?

## 2021-08-28 NOTE — Assessment & Plan Note (Addendum)
Patient also has mild leukocytosis, on chemotherapy. ?No fever. ?-CT abdomen and pelvis ?-Start him on Zosyn ?-Check C. difficile and GI pathogen ?-Urine and blood cultures ?-Supportive care ?

## 2021-08-28 NOTE — Assessment & Plan Note (Signed)
Patient was found to have none anion gap metabolic acidosis, most likely secondary to renal disease.  He was on bicarb supplement at home. ?-Continue bicarb supplement- increase the frequency to 3 times daily. ?-Continue to monitor ? ?

## 2021-08-28 NOTE — Assessment & Plan Note (Signed)
Creatinine at 2.95, it seems to be around 1.7 in October 2022, ?It got worse recently, was 5.96 on 08/17/2021 when he was found to have urinary obstruction and a Foley catheter was placed by urology on 08/20/2021.  Some improvement since then. ?-Monitor renal function ?-Renal ultrasound ?-Avoid nephrotoxins ?

## 2021-08-28 NOTE — H&P (Signed)
History and Physical    Patient: Kevin Morgan WUJ:[id number] DOB: June 30, 1950 DOA: 08/28/2021 DOS: the patient was seen and examined on 08/28/2021 PCP: Lorenda Ishihara, MD  Patient coming from: Outside Hospital  Chief Complaint: Generalized weakness and diarrhea. HPI: Kevin Morgan is a 71 y.o. 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 Clelia Croft, last chemo was 10 days ago, presented to [facility] ED with complaints of generalized weakness, nausea, vomiting and diarrhea.  Patient has a Foley catheter in place, placed by urology on 08/20/2021 per patient and he was advised to follow-up with them as an outpatient in 1 month.  Patient denies any lower abdominal pain or dysuria.  Per patient he is experiencing diarrhea for the past 4 days and developed nausea and vomiting since morning.  Patient was seen in [facility] for this reason and was transferred to [facility] for further management.  Patient denies any recent illnesses, fever or chills.  He was vaccinated for COVID-19 with 2 doses, no booster.  Patient denies any chest pain, shortness of breath, orthopnea or PND. Unable to tolerate much p.o. intake due to nausea and vomiting. On arrival patient was hemodynamically stable.  No labs or recent imaging found in the system. CBC, CMP, UA, urine and blood culture and CT abdomen was ordered.  Review of Systems: As mentioned in the history of present illness. All other systems reviewed and are negative. Past Medical History:  Diagnosis Date   Anal fissure    Anemia    Anesthesia complication 04/01/2021   Patient states that with general anesthesia he has difficulty  waking up and has SOB.   Colon polyps 2008/2011   Complication of anesthesia    Depression    DM type 2    ED (erectile dysfunction)    Family history of breast cancer    Family history of prostate  cancer    Glaucoma    bilateral, mild   Hearing loss    Hyperlipidemia    Hypertension    Personal history of prostate cancer    Prostate cancer (HCC) 06/13/2008   seed implants   Proteinuria    Sleep apnea, obstructive    cpap used   Tachycardia    Ureter cancer (HCC)    Urticaria    Past Surgical History:  Procedure Laterality Date   APPENDECTOMY  05/1996   mesh used- ruptured  appendix   COLONOSCOPY WITH PROPOFOL N/A 09/23/2014   Procedure: COLONOSCOPY WITH PROPOFOL;  Surgeon: Charolett Bumpers, MD;  Location: [facility];  Service: Endoscopy;  Laterality: N/A;   CYSTOSCOPY WITH URETHRAL DILATATION N/A 04/06/2021   Procedure: CYSTOSCOPY WITH URETHRAL DILATATION; URETHRAL BIOPSY; DORSAL INCISION; RETROGRADE URETHRAGRAM;  Surgeon: Jerilee Field, MD;  Location: [facility];  Service: Urology;  Laterality: N/A;   INCISIONAL HERNIA REPAIR  05/1996   s/p appendectomy-open   INSERTION PROSTATE RADIATION SEED     >10 yrs ago as of 04/01/2021   IR IMAGING GUIDED PORT INSERTION  08/11/2021   Social History:  reports that he quit smoking about 53 years ago. His smoking use included cigarettes. He has never used smokeless tobacco. He reports that he does not drink alcohol and does not use drugs.  Allergies  Allergen Reactions   Cinnamon Other (See Comments)    Lips and eye swelling.    Hydrocodone-Acetaminophen Swelling   Naprosyn [Naproxen] Hives    Family History  Problem Relation Age of Onset   Prostate cancer Father    Breast cancer Sister    Lung cancer Brother    Prostate cancer Maternal Uncle    Prostate cancer Maternal Uncle    Heart Problems Maternal Grandmother    Heart Problems Maternal Grandfather    Heart Problems Paternal Grandmother    Heart Problems Paternal Grandfather    Prostate cancer Nephew 53    Prior to Admission medications   Medication Sig Start Date End Date Taking? Authorizing Provider  loratadine (CLARITIN) 10 MG tablet Take  10 mg by mouth daily. 10/30/12  Yes [provider]  acetaminophen (TYLENOL) 650 MG CR tablet Take 650 mg by mouth every 8 (eight) hours as needed for pain.    [provider]  Cholecalciferol (VITAMIN D3) 1.25 MG (50000 UT) CAPS Take by mouth daily.    [provider]  clotrimazole-betamethasone (LOTRISONE) lotion Apply 1 application. topically in the morning and at bedtime.    [provider]  cyanocobalamin 1000 MCG tablet Take 100 mcg by mouth daily.    [provider]  diltiazem (CARDIZEM CD) 240 MG 24 hr capsule TAKE ONE CAPSULE BY MOUTH EVERY DAY Patient taking differently: 240 mg at bedtime. 05/03/16   Jake Bathe, MD  famotidine (PEPCID) 20 MG tablet Take 20 mg by mouth daily. 08/03/18   [provider]  fexofenadine (ALLEGRA) 180 MG tablet Take 180 mg by mouth daily.    [provider]  glimepiride (AMARYL) 2 MG tablet Take 2 mg by mouth daily. 06/23/21   [provider]  hydrocortisone (ANUSOL-HC) 2.5 % rectal cream Apply 1 application. topically 2 (two) times daily. 08/18/21   [provider]  lidocaine-prilocaine (EMLA) cream Apply 1 application topically as needed. 07/27/21   Benjiman Core, MD  losartan (COZAAR) 50 MG tablet Take 50 mg by mouth daily.    [provider]  losartan (COZAAR) 50 MG tablet Take 50 mg by mouth daily.    [provider]  LUMIGAN 0.01 % SOLN Place 1 drop into both eyes at bedtime. 08/23/21   [provider]  metFORMIN (GLUCOPHAGE) 500 MG tablet Take 1,000 mg by mouth 2 (two) times daily.     [provider]  metFORMIN (GLUCOPHAGE) 500 MG tablet Take 1,000 mg by mouth in the morning and at bedtime.    [provider]  naproxen sodium (ALEVE) 220 MG tablet Take 220 mg by mouth 2 (two) times daily as needed.    [provider]  nitrofurantoin, macrocrystal-monohydrate, (MACROBID) 100 MG capsule Take 1 capsule (100 mg total) by  mouth at bedtime. 04/06/21   Jerilee Field, MD  prochlorperazine (COMPAZINE) 10 MG tablet Take 1 tablet (10 mg total) by mouth every 6 (six) hours as needed for nausea or vomiting. 07/27/21   Benjiman Core, MD  simvastatin (ZOCOR) 20 MG tablet Take 20 mg by mouth daily.    [provider]  sodium bicarbonate 650 MG tablet Take 650 mg by mouth 2 (two) times daily.    [provider]  sodium chloride (OCEAN) 0.65 % SOLN nasal spray Place 1 spray into both nostrils as needed for congestion.    [provider]  tadalafil (CIALIS) 5 MG tablet Take 5 mg by mouth daily.    [provider]  tamsulosin (FLOMAX) 0.4 MG CAPS capsule Take 0.4 mg by mouth at bedtime. 05/15/17   [provider]    Physical Exam: Vitals:  08/28/21 1330 08/28/21 1746  BP: 125/66 137/62  Pulse: 89 (!) 106  Resp: 18 16  Temp: 97.6 F (36.4 C) 98.3 F (36.8 C)  TempSrc: Oral Oral  SpO2: 100% 100%   Vitals:   08/28/21 1330 08/28/21 1746  BP: 125/66 137/62  Pulse: 89 (!) 106  Resp: 18 16  Temp: 97.6 F (36.4 C) 98.3 F (36.8 C)  TempSrc: Oral Oral  SpO2: 100% 100%   General: Vital signs reviewed.  Patient is well-developed and well-nourished, in no acute distress and cooperative with exam.  Head: Normocephalic and atraumatic. Eyes: EOMI, conjunctivae normal, no scleral icterus.  Neck: Supple, trachea midline, normal ROM,   Cardiovascular: RRR, S1 normal, S2 normal, no murmurs, gallops, or rubs. Pulmonary/Chest: Clear to auscultation bilaterally, no wheezes, rales, or rhonchi. Abdominal: Soft, non-tender, non-distended, BS +,  Extremities: No lower extremity edema bilaterally,  pulses symmetric and intact bilaterally. Neurological: A&O x3, Strength is normal and symmetric bilaterally, cranial nerve II-XII are grossly intact, no focal motor deficit, sensory intact to light touch bilaterally.  Psychiatric: Normal mood and affect. speech and behavior is normal.    Data Reviewed: I personally reviewed prior data, labs and images.  Assessment and Plan: * Nausea vomiting and diarrhea Patient also has mild leukocytosis, on chemotherapy. No fever. -CT abdomen and pelvis -Start him on Zosyn -Check C. difficile and GI pathogen -Urine and blood cultures -Supportive care  Anemia associated with chemotherapy Hemoglobin at 7.  No obvious bleeding. I tried ordering blood transfusion but system is giving me alerts that patient has refused transfusion in the past, unable to locate any note. -Type and screen -Monitor hemoglobin -Please discussed with patient if hemoglobin falls below 7. -Anemia panel  Urinary obstruction Patient with concern of urinary obstruction and a Foley catheter was placed by urology on 08/20/2021. -Continue with Foley catheter -Outpatient urology follow-up in 1 month as recommended  Metabolic acidosis Patient was found to have none anion gap metabolic acidosis, most likely secondary to renal disease.  He was on bicarb supplement at home. -Continue bicarb supplement- increase the frequency to 3 times daily. -Continue to monitor   Chronic kidney disease (CKD), stage III (moderate) (HCC) Creatinine at 2.95, it seems to be around 1.7 in October 2022, It got worse recently, was 5.96 on 08/17/2021 when he was found to have urinary obstruction and a Foley catheter was placed by urology on 08/20/2021.  Some improvement since then. 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  Last chemo was 10 days ago. -Continue with outpatient oncology follow-up  Advance Care Planning:   Code Status: Full Code   Consults: None  Family Communication:   Severity of Illness: The appropriate patient status for this patient is OBSERVATION. Observation status is judged to be reasonable and necessary in order to provide the required intensity of service to ensure the patient's  safety. The patient's presenting symptoms, physical exam findings, and initial radiographic and laboratory data in the context of their medical condition is felt to place them at decreased risk for further clinical deterioration. Furthermore, it is anticipated that the patient will be medically stable for discharge from the hospital within 2 midnights of admission.   Author: Arnetha Courser, MD 08/28/2021 7:06 PM  For on call review [email or web address].

## 2021-08-28 NOTE — Progress Notes (Signed)
Pt arrived to room 1503. RN paged admitting.  ?

## 2021-08-28 NOTE — Assessment & Plan Note (Signed)
Patient with concern of urinary obstruction and a Foley catheter was placed by urology on 08/20/2021. ?-Continue with Foley catheter ?-Outpatient urology follow-up in 1 month as recommended ?

## 2021-08-29 DIAGNOSIS — E119 Type 2 diabetes mellitus without complications: Secondary | ICD-10-CM

## 2021-08-29 DIAGNOSIS — N179 Acute kidney failure, unspecified: Secondary | ICD-10-CM

## 2021-08-29 DIAGNOSIS — R197 Diarrhea, unspecified: Secondary | ICD-10-CM | POA: Diagnosis not present

## 2021-08-29 DIAGNOSIS — Z531 Procedure and treatment not carried out because of patient's decision for reasons of belief and group pressure: Secondary | ICD-10-CM

## 2021-08-29 DIAGNOSIS — R112 Nausea with vomiting, unspecified: Secondary | ICD-10-CM | POA: Diagnosis not present

## 2021-08-29 LAB — COMPREHENSIVE METABOLIC PANEL
ALT: 50 U/L — ABNORMAL HIGH (ref 0–44)
AST: 28 U/L (ref 15–41)
Albumin: 2.4 g/dL — ABNORMAL LOW (ref 3.5–5.0)
Alkaline Phosphatase: 102 U/L (ref 38–126)
Anion gap: 9 (ref 5–15)
BUN: 16 mg/dL (ref 8–23)
CO2: 15 mmol/L — ABNORMAL LOW (ref 22–32)
Calcium: 8.3 mg/dL — ABNORMAL LOW (ref 8.9–10.3)
Chloride: 115 mmol/L — ABNORMAL HIGH (ref 98–111)
Creatinine, Ser: 2.68 mg/dL — ABNORMAL HIGH (ref 0.61–1.24)
GFR, Estimated: 24 mL/min — ABNORMAL LOW (ref >60)
Glucose, Bld: 136 mg/dL — ABNORMAL HIGH (ref 70–99)
Potassium: 3.9 mmol/L (ref 3.5–5.1)
Sodium: 139 mmol/L (ref 135–145)
Total Bilirubin: 0.4 mg/dL (ref 0.3–1.2)
Total Protein: 6.5 g/dL (ref 6.5–8.1)

## 2021-08-29 LAB — GASTROINTESTINAL PANEL BY PCR, STOOL (REPLACES STOOL CULTURE)

## 2021-08-29 LAB — CBC
HCT: 21.6 % — ABNORMAL LOW (ref 39.0–52.0)
Hemoglobin: 6.7 g/dL — CL (ref 13.0–17.0)
MCH: 20.6 pg — ABNORMAL LOW (ref 26.0–34.0)
MCHC: 31 g/dL (ref 30.0–36.0)
MCV: 66.5 fL — ABNORMAL LOW (ref 80.0–100.0)
Platelets: 366 10*3/uL (ref 150–400)
RBC: 3.25 MIL/uL — ABNORMAL LOW (ref 4.22–5.81)
RDW: 19.3 % — ABNORMAL HIGH (ref 11.5–15.5)
WBC: 12.4 10*3/uL — ABNORMAL HIGH (ref 4.0–10.5)
nRBC: 0.8 % — ABNORMAL HIGH (ref 0.0–0.2)

## 2021-08-29 LAB — PROCALCITONIN: Procalcitonin: 0.96 ng/mL

## 2021-08-29 LAB — GLUCOSE, CAPILLARY: Glucose-Capillary: 131 mg/dL — ABNORMAL HIGH (ref 70–99)

## 2021-08-29 MED ORDER — ENOXAPARIN SODIUM 30 MG/0.3ML IJ SOSY
30.0000 mg | PREFILLED_SYRINGE | INTRAMUSCULAR | Status: DC
  Administered 2021-08-29: 30 mg via SUBCUTANEOUS
  Filled 2021-08-29: qty 0.3

## 2021-08-29 NOTE — Plan of Care (Signed)
Afebrile overnight. Pt had one BM overnight. Able to tolerate large volume liquid such as water and jello without c/o nausea or vomiting. Pt received tylenol for mild headache with good response from intervention.  ? ?Blood bank needs refusal for blood and blood product form before they can run the type and screen on this patient. Provider notified and provider deferred to primary team in the AM. Refusal form awaiting signature from provider at the front desk. This will need to be tubed to blood bank once everyone has signed it. ? ? ?Problem: Education: ?Goal: Knowledge of General Education information will improve ?Description: Including pain rating scale, medication(s)/side effects and non-pharmacologic comfort measures ?Outcome: Progressing ?  ?Problem: Health Behavior/Discharge Planning: ?Goal: Ability to manage health-related needs will improve ?Outcome: Progressing ?  ?Problem: Clinical Measurements: ?Goal: Ability to maintain clinical measurements within normal limits will improve ?Outcome: Progressing ?Goal: Will remain free from infection ?Outcome: Progressing ?Goal: Diagnostic test results will improve ?Outcome: Progressing ?Goal: Respiratory complications will improve ?Outcome: Progressing ?Goal: Cardiovascular complication will be avoided ?Outcome: Progressing ?  ?Problem: Activity: ?Goal: Risk for activity intolerance will decrease ?Outcome: Progressing ?  ?Problem: Nutrition: ?Goal: Adequate nutrition will be maintained ?Outcome: Progressing ?  ?Problem: Coping: ?Goal: Level of anxiety will decrease ?Outcome: Progressing ?  ?Problem: Elimination: ?Goal: Will not experience complications related to bowel motility ?Outcome: Progressing ?Goal: Will not experience complications related to urinary retention ?Outcome: Progressing ?  ?Problem: Pain Managment: ?Goal: General experience of comfort will improve ?Outcome: Progressing ?  ?Problem: Safety: ?Goal: Ability to remain free from injury will  improve ?Outcome: Progressing ?  ?Problem: Skin Integrity: ?Goal: Risk for impaired skin integrity will decrease ?Outcome: Progressing ?  ?

## 2021-08-29 NOTE — Progress Notes (Signed)
?[facility] Progress Note ? ?Patient: Kevin Morgan     ?YIF:[id number]  ?DOA: 08/28/2021   ?PCP: [name], MD  ? ?  ?  ?Brief hospital course: ?This is a 71 year old male with diabetes mellitus, hypertension, history of prostate cancer in the past, urothelial cancer who received his first chemotherapy a couple of weeks ago.  The patient presents for diarrhea from outside facility.  Notes also state that he was having vomiting however, he states that he has had almost daily vomiting since the chemo and this was not the main reason he presented.  He is a poor historian and therefore it is difficult to get much detail regarding the diarrhea but apparently it has been going on for a few days. ?He was started on Zosyn when he was admitted and GI/stool studies were ordered. ? ?Subjective:  ?He states that although he still is having watery stools, they are not as frequent as before. ?Assessment and Plan: ?Assessment and Plan: ?* Nausea vomiting and diarrhea ?-Essentially admitted for diarrhea ?- There is no fever, white blood cell count was 12 and has not improved yet ?- Stool studies ordered- C. difficile toxin is negative-GI pathogen panel is pending ?- We will stop Zosyn for now and continue to follow ? ?Anemia associated with chemotherapy ?-Hemoglobin is 6.7 today ?-He has no acute bleeding and does not appear to be symptomatic from this ?- He is a [employer] and therefore we are not ordering blood ? ?Urinary obstruction ?- Foley catheter was placed by urology on 08/20/2021. ? ?Metabolic acidosis ?- due to renal failure possible exacerbated by diarrhea ?- cont Bicarb tabs ? ?AKI on Chronic kidney disease (CKD), stage III (moderate) (HCC) ?Creatinine at 2.95, it seems to be around 1.7 in October 2022, ?It got worse recently, was 5.96 on 08/17/2021 when he was found to have urinary obstruction and a Foley catheter was placed by urology on 08/20/2021.  Some improvement since then. ?-Monitor  renal function ? - renal ultrasound> Marked asymmetric left renal cortical atrophy, stable since prior examination. No hydronephrosis ? ?Urothelial cancer ([place]) ?Being managed by oncology.  Last chemo was 10 days ago. ?-Continue with outpatient oncology follow-up ? ?Discharge around foley ?- per RN, the patient had milky discharge from penis (around his foley and all over groin) today- will follow-  ? ? ?DVT prophylaxis:  ? ?  Code Status: Full Code  ?Level of Care: Level of care: Med-Surg ?Disposition Plan:  ?Status is: Observation ? ? ?Objective: ?  ?Vitals:  ? 08/28/21 1746 08/28/21 2028 08/29/21 0114 08/29/21 0412  ?BP: 137/62 126/60 116/65 100/62  ?Pulse: (!) 106 86 100 92  ?Resp: '16 16 20 20  '$ ?Temp: 98.3 ?F (36.8 ?C) 98.9 ?F (37.2 ?C) 99 ?F (37.2 ?C) 97.7 ?F (36.5 ?C)  ?TempSrc: Oral Oral Oral Oral  ?SpO2: 100% 100% 100% 100%  ? ?There were no vitals filed for this visit. ?Exam: ?General exam: Appears comfortable  ?HEENT: PERRLA, oral mucosa moist, no sclera icterus or thrush ?Respiratory system: Clear to auscultation. Respiratory effort normal. ?Cardiovascular system: S1 & S2 heard, regular rate and rhythm ?Gastrointestinal system: Abdomen soft, non-tender, nondistended. Normal bowel sounds   ?Central nervous system: Alert and oriented. No focal neurological deficits. ?Extremities: No cyanosis, clubbing or edema ?Skin: No rashes or ulcers ?Psychiatry:  Mood & affect appropriate.   ? ?Imaging and lab data was personally reviewed ? ? ? CBC: ?Recent Labs  ?Lab 08/28/21 ?1554 08/29/21 ?0820  ?WBC 12.2* 12.4*  ?  NEUTROABS 7.9*  --   ?HGB 7.0* 6.7*  ?HCT 23.5* 21.6*  ?MCV 67.3* 66.5*  ?PLT 344 366  ? ?Basic Metabolic Panel: ?Recent Labs  ?Lab 08/28/21 ?1554 08/29/21 ?0820  ?NA 142 139  ?K 4.3 3.9  ?CL 121* 115*  ?CO2 15* 15*  ?GLUCOSE 130* 136*  ?BUN 20 16  ?CREATININE 2.95* 2.68*  ?CALCIUM 8.4* 8.3*  ? ?GFR: ?Estimated Creatinine Clearance: 31.2 mL/min (A) (by C-G formula based on SCr of 2.68 mg/dL  (H)). ? ?Scheduled Meds: ? calcium-vitamin D  1 tablet Oral Q breakfast  ? Chlorhexidine Gluconate Cloth  6 each Topical Daily  ? famotidine  20 mg Oral Daily  ? hydrocortisone  1 application. Topical BID  ? insulin aspart  0-15 Units Subcutaneous TID WC  ? insulin aspart  0-5 Units Subcutaneous QHS  ? loratadine  10 mg Oral Daily  ? simvastatin  20 mg Oral q1800  ? sodium bicarbonate  650 mg Oral TID  ? tamsulosin  0.4 mg Oral QHS  ? cyanocobalamin  100 mcg Oral Daily  ? ?Continuous Infusions: ? sodium chloride 10 mL/hr at 08/28/21 2156  ? ? ? LOS: 1 day  ? ?Author: ?Debbe Odea  ?08/29/2021 2:41 PM ?   ?

## 2021-08-29 NOTE — Progress Notes (Incomplete)
Date and time results received: 08/29/21 0850 ? ?(use smartphrase ".now" to insert current time) ? ?Test: Hemoglobin ?Critical Value: 6.7 ? ?Name of Provider Notified: Wynelle Cleveland, MD ? ?Orders Received? Or Actions Taken?:  ?

## 2021-08-30 ENCOUNTER — Encounter (HOSPITAL_COMMUNITY): Payer: Self-pay | Admitting: Internal Medicine

## 2021-08-30 DIAGNOSIS — R112 Nausea with vomiting, unspecified: Secondary | ICD-10-CM | POA: Diagnosis not present

## 2021-08-30 DIAGNOSIS — R197 Diarrhea, unspecified: Secondary | ICD-10-CM | POA: Diagnosis not present

## 2021-08-30 LAB — BASIC METABOLIC PANEL
Anion gap: 6 (ref 5–15)
BUN: 15 mg/dL (ref 8–23)
CO2: 18 mmol/L — ABNORMAL LOW (ref 22–32)
Calcium: 8.1 mg/dL — ABNORMAL LOW (ref 8.9–10.3)
Chloride: 118 mmol/L — ABNORMAL HIGH (ref 98–111)
Creatinine, Ser: 2.75 mg/dL — ABNORMAL HIGH (ref 0.61–1.24)
GFR, Estimated: 24 mL/min — ABNORMAL LOW (ref >60)
Glucose, Bld: 105 mg/dL — ABNORMAL HIGH (ref 70–99)
Potassium: 3.8 mmol/L (ref 3.5–5.1)
Sodium: 142 mmol/L (ref 135–145)

## 2021-08-30 LAB — URINE CULTURE: Culture: NO GROWTH

## 2021-08-30 LAB — GLUCOSE, CAPILLARY
Glucose-Capillary: 127 mg/dL — ABNORMAL HIGH (ref 70–99)
Glucose-Capillary: 113 mg/dL — ABNORMAL HIGH (ref 70–99)
Glucose-Capillary: 146 mg/dL — ABNORMAL HIGH (ref 70–99)
Glucose-Capillary: 142 mg/dL — ABNORMAL HIGH (ref 70–99)
Glucose-Capillary: 122 mg/dL — ABNORMAL HIGH (ref 70–99)

## 2021-08-30 LAB — CBC
HCT: 19.8 % — ABNORMAL LOW (ref 39.0–52.0)
Hemoglobin: 6.2 g/dL — CL (ref 13.0–17.0)
MCH: 20.6 pg — ABNORMAL LOW (ref 26.0–34.0)
MCHC: 31.3 g/dL (ref 30.0–36.0)
MCV: 65.8 fL — ABNORMAL LOW (ref 80.0–100.0)
Platelets: 391 10*3/uL (ref 150–400)
RBC: 3.01 MIL/uL — ABNORMAL LOW (ref 4.22–5.81)
RDW: 19.2 % — ABNORMAL HIGH (ref 11.5–15.5)
WBC: 12.1 10*3/uL — ABNORMAL HIGH (ref 4.0–10.5)
nRBC: 0.7 % — ABNORMAL HIGH (ref 0.0–0.2)

## 2021-08-30 MED ORDER — HEPARIN SOD (PORK) LOCK FLUSH 100 UNIT/ML IV SOLN
500.0000 [IU] | INTRAVENOUS | Status: AC | PRN
  Administered 2021-08-30: 500 [IU]
  Filled 2021-08-30: qty 5

## 2021-08-30 NOTE — Progress Notes (Signed)
No acute event overnight. Pt had one BM that was mushy brown consistency with mucus in it. No c/o nausea or vomiting. Pt continue to tolerate liquids and was able to eat a piece of cake overnight.  ?

## 2021-08-30 NOTE — Clinical Social Work Note (Signed)
?  Transition of Care (TOC) Screening Note ? ? ?Patient Details  ?Name: Kevin Morgan ?Date of Birth: 08-28-1950 ? ? ?Transition of Care (TOC) CM/SW Contact:    ?Trish Mage, LCSW ?Phone Number: ?08/30/2021, 8:48 AM ? ? ? ?Transition of Care Department [facility]) has reviewed patient and no TOC needs have been identified at this time. We will continue to monitor patient advancement through interdisciplinary progression rounds. If new patient transition needs arise, please place a TOC consult. ? ? ?

## 2021-08-30 NOTE — Discharge Summary (Signed)
?Physician Discharge Summary ?  ?Patient: Kevin Morgan MRN: [id number] DOB: 08-08-1950  ?Admit date:     08/28/2021  ?Discharge date: 08/30/21  ?Discharge Physician: Debbe Odea  ? ?PCP: [name], MD  ? ?Recommendations at discharge:  ? ? F/u BP, Hgb and renal function ? holding antihypertensives ? ?Discharge Diagnoses: ?Principal Problem: ?  Nausea vomiting and diarrhea ?Active Problems: ?  Personal history of prostate cancer ?  Urothelial cancer ([place]) ?  CKD (chronic kidney disease) stage 4, GFR 15-29 ml/min (HCC) ?  Metabolic acidosis ?  Urinary obstruction ?  Anemia associated with chemotherapy ?  Blood transfusion declined because patient is Jehovah's Witness ?  DM (diabetes mellitus) ([place]) ? ?Resolved Problems: ?  * No resolved hospital problems. * ? ?Hospital Course: ?This is a 71 year old male with diabetes mellitus, hypertension, history of prostate cancer in the past, urothelial cancer who received his first chemotherapy a couple of weeks ago.  The patient presents for diarrhea from outside facility.  Notes also state that he was having vomiting however, he states that he has had almost daily vomiting since the chemo and this was not the main reason he presented.  He is a poor historian and therefore it is difficult to get much detail regarding the diarrhea but apparently it has been going on for a few days. ?He was started on Zosyn when he was admitted and GI/stool studies were ordered. ? ?Assessment and Plan: ?* Nausea vomiting and diarrhea ? Essentially admitted for diarrhea ?- There is no fever, white blood cell count was 12 and has not improved yet ?- Stool studies ordered- C. difficile toxin & GI pathogen negative- stopped Zosyn on 3/19- he has continue to improve.  ?- Eating solids since yesterday and not having any diarrhea ?  ? ?Anemia associated with chemotherapy ?-Hemoglobin is 6.2 today ?-He has no acute bleeding and does not appear to be symptomatic from this yet ?- He is a  J[employer] Witness and therefore we are not ordering blood ? ?h/o HTN ?- SBP has been in low 100s- we have been holding antihypertensives and I don't recommend resuming them at this time- I have discussed this with him ?  ?Chronic kidney disease (CKD), stage 4   ?Creatinine at 2.95, it seems to be around 1.7 in October 2022, ?It got worse recently, was 5.96 on 08/17/2021 when he was found to have urinary obstruction and a Foley catheter was placed by urology on 08/20/2021.  Some improvement since then. ? - renal ultrasound> Marked asymmetric left renal cortical atrophy, stable since prior examination. No hydronephrosis ? ?Urinary obstruction ?- Foley catheter was placed by urology on 08/20/2021. ?  ? ?Metabolic acidosis ?due to renal failure possible exacerbated by diarrhea ?- cont Bicarb tabs ? ?Urothelial cancer ([place]) ?Being managed by oncology.  ?- he received his first chemo a couple of weeks ago ?-Continue with outpatient oncology follow-up ? ?Discharge around foley ?- per RN, the patient had milky discharge from penis (around his foley and all over groin) today-  ?- I examined him yesterday and again today- I only see a very small amount- I have asked him to discuss it with his urologist or PCP if it becomes and issue ? ?  ? ? ?Consultants: none ?Procedures performed: none  ?Disposition: Home ?Diet recommendation:  ?Regular diet ?DISCHARGE MEDICATION: ?Allergies as of 08/30/2021   ? ?   Reactions  ? Cinnamon Other (See Comments)  ? Lips and eye swelling.   ?  Hydrocodone-acetaminophen Swelling  ? Naprosyn [naproxen] Hives  ? ?  ? ?  ?Medication List  ?  ? ?STOP taking these medications   ? ?diltiazem 240 MG 24 hr capsule ?Commonly known as: CARDIZEM CD ?  ?losartan 50 MG tablet ?Commonly known as: COZAAR ?  ? ?  ? ?TAKE these medications   ? ?acetaminophen 650 MG CR tablet ?Commonly known as: TYLENOL ?Take 650 mg by mouth every 8 (eight) hours as needed for pain. ?  ?calcium-vitamin D 500-5 MG-MCG tablet ?Commonly  known as: OSCAL WITH D ?Take 1 tablet by mouth daily with breakfast. ?  ?clotrimazole-betamethasone lotion ?Commonly known as: LOTRISONE ?Apply 1 application. topically in the morning and at bedtime. ?  ?cyanocobalamin 1000 MCG tablet ?Take 100 mcg by mouth daily. ?  ?famotidine 20 MG tablet ?Commonly known as: PEPCID ?Take 20 mg by mouth daily. ?  ?fexofenadine 180 MG tablet ?Commonly known as: ALLEGRA ?Take 180 mg by mouth daily. ?  ?glimepiride 2 MG tablet ?Commonly known as: AMARYL ?Take 2 mg by mouth daily. ?  ?hydrocortisone 2.5 % rectal cream ?Commonly known as: ANUSOL-HC ?Apply 1 application. topically 2 (two) times daily. ?  ?lidocaine-prilocaine cream ?Commonly known as: EMLA ?Apply 1 application topically as needed. ?  ?loratadine 10 MG tablet ?Commonly known as: CLARITIN ?Take 10 mg by mouth daily. ?  ?Lumigan 0.01 % Soln ?Generic drug: bimatoprost ?Place 1 drop into both eyes at bedtime. ?  ?metFORMIN 500 MG tablet ?Commonly known as: GLUCOPHAGE ?Take 1,000 mg by mouth 2 (two) times daily. ?  ?prochlorperazine 10 MG tablet ?Commonly known as: COMPAZINE ?Take 1 tablet (10 mg total) by mouth every 6 (six) hours as needed for nausea or vomiting. ?  ?simvastatin 20 MG tablet ?Commonly known as: ZOCOR ?Take 20 mg by mouth daily. ?  ?sodium bicarbonate 650 MG tablet ?Take 650 mg by mouth 2 (two) times daily. ?  ?sodium chloride 0.65 % Soln nasal spray ?Commonly known as: OCEAN ?Place 1 spray into both nostrils as needed for congestion. ?  ?tadalafil 5 MG tablet ?Commonly known as: CIALIS ?Take 5 mg by mouth daily. ?  ?tamsulosin 0.4 MG Caps capsule ?Commonly known as: FLOMAX ?Take 0.4 mg by mouth at bedtime. ?  ?Vitamin D3 1.25 MG (50000 UT) Caps ?Take by mouth daily. ?  ? ?  ? ? ?  ? ? ?Condition at discharge: stable ? ?The results of significant diagnostics from this hospitalization (including imaging, microbiology, ancillary and laboratory) are listed below for reference.  ? ?Imaging Studies: ?US  RENAL ? ?Result Date: 08/28/2021 ?CLINICAL DATA:  Acute renal insufficiency EXAM: RENAL / URINARY TRACT ULTRASOUND COMPLETE COMPARISON:  03/03/2021, CT 04/22/2021 FINDINGS: Right Kidney: Renal measurements: 11.0 x 5.7 x 6.5 cm = volume: 213 mL. Echogenicity within normal limits. Preserved cortical thickness. No mass or hydronephrosis visualized. Left Kidney: Renal measurements: 7.1 x 4.5 by 3.3 cm = volume: 74 mL. There is marked left renal cortical atrophy, unchanged from prior examination. There is thickening of the pararenal fat. These findings are better demonstrated on the prior examination of 03/03/2021. No hydronephrosis. No intrarenal masses or calcifications. Bladder: The bladder is circumferentially thick walled. Foley catheter balloon is seen within a decompressed bladder lumen. Other: None. IMPRESSION: Marked asymmetric left renal cortical atrophy, stable since prior examination. No hydronephrosis. Circumferential bladder wall thickening suggesting changes of chronic bladder outlet obstruction. Foley catheter balloon noted within a decompressed bladder lumen. Electronically Signed   By: Linwood Dibbles.D.  On: 08/28/2021 20:57  ? ?IR IMAGING GUIDED PORT INSERTION ? ?Result Date: 08/11/2021 ?INDICATION: Malignant neoplasm of ureter, unspecified laterality (HCC) EXAM: Ultrasound-guided puncture of the right internal jugular vein Placement of a right-sided chest port using fluoroscopic guidance MEDICATIONS: None ANESTHESIA/SEDATION: Moderate (conscious) sedation was employed during this procedure. A total of Versed 3 mg and Fentanyl 100 mcg was administered intravenously. Moderate Sedation Time: 27 minutes. The patient's level of consciousness and vital signs were monitored continuously by radiology nursing throughout the procedure under my direct supervision. FLUOROSCOPY TIME:  Fluoroscopy Time: 0.8 minutes (4 mGy) COMPLICATIONS: None immediate. PROCEDURE: Informed written consent was obtained from the  patient after a thorough discussion of the procedural risks, benefits and alternatives. All questions were addressed. Maximal Sterile Barrier Technique was utilized including caps, mask, sterile gowns, sterile gloves, s

## 2021-08-30 NOTE — Progress Notes (Signed)
Critical lab Hemoglobin 6.2. Night coverage X. Blount, NP notified, no new order received.  ? ? ?

## 2021-09-01 ENCOUNTER — Inpatient Hospital Stay: Payer: Medicare PPO | Admitting: Oncology

## 2021-09-01 ENCOUNTER — Inpatient Hospital Stay: Payer: Medicare PPO

## 2021-09-01 ENCOUNTER — Telehealth: Payer: Self-pay | Admitting: *Deleted

## 2021-09-01 ENCOUNTER — Other Ambulatory Visit: Payer: Self-pay | Admitting: Oncology

## 2021-09-01 DIAGNOSIS — C669 Malignant neoplasm of unspecified ureter: Secondary | ICD-10-CM

## 2021-09-01 NOTE — Progress Notes (Signed)
Events in the last we could noted.  The patient has been hospitalized for renal failure and worsening anemia exacerbated by systemic chemotherapy.  The plan is to switch him from cytotoxic Chemotherapy to immunotherapy for better tolerance and less myelosuppressive side effects.  He has refused packed red cell transfusion for religious reasons and immunotherapy should help be a better option to decrease the need for growth factor support. ?

## 2021-09-01 NOTE — Progress Notes (Signed)
DISCONTINUE ON PATHWAY REGIMEN - Bladder ? ? ?  A cycle is every 21 days: ?    Carboplatin  ?    Gemcitabine  ? ?**Always confirm dose/schedule in your pharmacy ordering system** ? ?REASON: Toxicities / Adverse Event ?PRIOR TREATMENT: [id number]: Carboplatin AUC=5 D1 + Gemcitabine 1,000 mg/m2 D1, 8 q21 Days for a Maximum of 6 Cycles ?TREATMENT RESPONSE: Unable to Evaluate ? ?START ON PATHWAY REGIMEN - Bladder ? ? ?  A cycle is every 21 days: ?    Pembrolizumab  ? ?**Always confirm dose/schedule in your pharmacy ordering system** ? 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

## 2021-09-01 NOTE — Telephone Encounter (Signed)
Called pt to f/u on missed appt for this morning. Pt stated his nephew came in contact with a family member that has covid and do not want to risk riding to appt with nephew. Per Dr.Shadad, pt did not need to be rescheduled but f/u at next appt. Called pt back with directions from provider. Pt verbalized understanding  ?

## 2021-09-03 LAB — CULTURE, BLOOD (ROUTINE X 2)
Culture: NO GROWTH
Culture: NO GROWTH
Special Requests: ADEQUATE
Special Requests: ADEQUATE

## 2021-09-15 NOTE — Progress Notes (Signed)
Pharmacist Chemotherapy Monitoring - Initial Assessment   ? ?Anticipated start date: 09/22/21   ? ?The following has been reviewed per standard work regarding the patient's treatment regimen: ?The patient's diagnosis, treatment plan and drug doses, and organ/hematologic function ?Lab orders and baseline tests specific to treatment regimen  ?The treatment plan start date, drug sequencing, and pre-medications ?Prior authorization status  ?Patient's documented medication list, including drug-drug interaction screen and prescriptions for anti-emetics and supportive care specific to the treatment regimen ?The drug concentrations, fluid compatibility, administration routes, and timing of the medications to be used ?The patient's access for treatment and lifetime cumulative dose history, if applicable  ?The patient's medication allergies and previous infusion related reactions, if applicable  ? ?Changes made to treatment plan:  ?N/A ? ?Follow up needed:  ?1) Pending authorization for treatment  ?2) F/u labs including Scr on 09/22/21 ? ? ?Raul Del Vander, [place], BCPS, BCOP ?09/15/2021  4:11 PM  ?

## 2021-09-22 ENCOUNTER — Other Ambulatory Visit: Payer: Self-pay

## 2021-09-22 ENCOUNTER — Inpatient Hospital Stay: Payer: Medicare PPO | Admitting: Oncology

## 2021-09-22 ENCOUNTER — Telehealth: Payer: Self-pay

## 2021-09-22 ENCOUNTER — Inpatient Hospital Stay: Payer: Medicare PPO

## 2021-09-22 ENCOUNTER — Inpatient Hospital Stay: Payer: Medicare PPO | Attending: Genetic Counselor

## 2021-09-22 VITALS — BP 124/65 | HR 70 | Temp 97.9°F | Resp 18 | Ht 67.0 in | Wt 246.2 lb

## 2021-09-22 DIAGNOSIS — C669 Malignant neoplasm of unspecified ureter: Secondary | ICD-10-CM | POA: Diagnosis not present

## 2021-09-22 DIAGNOSIS — N184 Chronic kidney disease, stage 4 (severe): Secondary | ICD-10-CM | POA: Insufficient documentation

## 2021-09-22 DIAGNOSIS — D631 Anemia in chronic kidney disease: Secondary | ICD-10-CM | POA: Insufficient documentation

## 2021-09-22 DIAGNOSIS — C7989 Secondary malignant neoplasm of other specified sites: Secondary | ICD-10-CM | POA: Insufficient documentation

## 2021-09-22 DIAGNOSIS — Z79899 Other long term (current) drug therapy: Secondary | ICD-10-CM | POA: Insufficient documentation

## 2021-09-22 DIAGNOSIS — C680 Malignant neoplasm of urethra: Secondary | ICD-10-CM | POA: Insufficient documentation

## 2021-09-22 DIAGNOSIS — Z95828 Presence of other vascular implants and grafts: Secondary | ICD-10-CM

## 2021-09-22 DIAGNOSIS — Z5112 Encounter for antineoplastic immunotherapy: Secondary | ICD-10-CM | POA: Diagnosis present

## 2021-09-22 DIAGNOSIS — C689 Malignant neoplasm of urinary organ, unspecified: Secondary | ICD-10-CM

## 2021-09-22 LAB — CMP (CANCER CENTER ONLY)
ALT: 11 U/L (ref 0–44)
AST: 10 U/L — ABNORMAL LOW (ref 15–41)
Albumin: 3.2 g/dL — ABNORMAL LOW (ref 3.5–5.0)
Alkaline Phosphatase: 72 U/L (ref 38–126)
Anion gap: 7 (ref 5–15)
BUN: 17 mg/dL (ref 8–23)
CO2: 21 mmol/L — ABNORMAL LOW (ref 22–32)
Calcium: 9 mg/dL (ref 8.9–10.3)
Chloride: 111 mmol/L (ref 98–111)
Creatinine: 1.94 mg/dL — ABNORMAL HIGH (ref 0.61–1.24)
GFR, Estimated: 36 mL/min — ABNORMAL LOW (ref >60)
Glucose, Bld: 107 mg/dL — ABNORMAL HIGH (ref 70–99)
Potassium: 4.2 mmol/L (ref 3.5–5.1)
Sodium: 139 mmol/L (ref 135–145)
Total Bilirubin: 0.3 mg/dL (ref 0.3–1.2)
Total Protein: 7.4 g/dL (ref 6.5–8.1)

## 2021-09-22 LAB — CBC WITH DIFFERENTIAL (CANCER CENTER ONLY)
Abs Immature Granulocytes: 0.06 10*3/uL (ref 0.00–0.07)
Basophils Absolute: 0.1 10*3/uL (ref 0.0–0.1)
Basophils Relative: 1 %
Eosinophils Absolute: 0.9 10*3/uL — ABNORMAL HIGH (ref 0.0–0.5)
Eosinophils Relative: 7 %
HCT: 22.3 % — ABNORMAL LOW (ref 39.0–52.0)
Hemoglobin: 6.8 g/dL — CL (ref 13.0–17.0)
Immature Granulocytes: 1 %
Lymphocytes Relative: 23 %
Lymphs Abs: 2.8 10*3/uL (ref 0.7–4.0)
MCH: 20.9 pg — ABNORMAL LOW (ref 26.0–34.0)
MCHC: 30.5 g/dL (ref 30.0–36.0)
MCV: 68.4 fL — ABNORMAL LOW (ref 80.0–100.0)
Monocytes Absolute: 1 10*3/uL (ref 0.1–1.0)
Monocytes Relative: 8 %
Neutro Abs: 7.5 10*3/uL (ref 1.7–7.7)
Neutrophils Relative %: 60 %
Platelet Count: 298 10*3/uL (ref 150–400)
RBC: 3.26 MIL/uL — ABNORMAL LOW (ref 4.22–5.81)
RDW: 21.2 % — ABNORMAL HIGH (ref 11.5–15.5)
WBC Count: 12.3 10*3/uL — ABNORMAL HIGH (ref 4.0–10.5)
nRBC: 0 % (ref 0.0–0.2)

## 2021-09-22 LAB — TSH: TSH: 1.21 u[IU]/mL (ref 0.320–4.118)

## 2021-09-22 MED ORDER — HEPARIN SOD (PORK) LOCK FLUSH 100 UNIT/ML IV SOLN
500.0000 [IU] | Freq: Once | INTRAVENOUS | Status: AC | PRN
  Administered 2021-09-22: 500 [IU]

## 2021-09-22 MED ORDER — SODIUM CHLORIDE 0.9 % IV SOLN: Freq: Once | INTRAVENOUS | Status: AC

## 2021-09-22 MED ORDER — EPOETIN ALFA-EPBX 20000 UNIT/ML IJ SOLN
20000.0000 [IU] | Freq: Once | INTRAMUSCULAR | Status: AC
  Administered 2021-09-22: 20000 [IU] via SUBCUTANEOUS
  Filled 2021-09-22: qty 1

## 2021-09-22 MED ORDER — SODIUM CHLORIDE 0.9 % IV SOLN
200.0000 mg | Freq: Once | INTRAVENOUS | Status: AC
  Administered 2021-09-22: 200 mg via INTRAVENOUS
  Filled 2021-09-22: qty 8

## 2021-09-22 MED ORDER — SODIUM CHLORIDE 0.9% FLUSH
10.0000 mL | INTRAVENOUS | Status: DC | PRN
  Administered 2021-09-22: 10 mL

## 2021-09-22 MED ORDER — SODIUM CHLORIDE 0.9% FLUSH
10.0000 mL | Freq: Once | INTRAVENOUS | Status: AC
  Administered 2021-09-22: 10 mL

## 2021-09-22 NOTE — Telephone Encounter (Signed)
CRITICAL VALUE STICKER ? ?CRITICAL VALUE: HGB 6.8 g/dl ? ?RECEIVER (on-site recipient of call):Rosann Gorum RN ? ?DATE & TIME NOTIFIED: 09/22/21 11:53 AM ? ? ? ?MD NOTIFIED: Sadad ? ?TIME OF NOTIFICATION:11:54 AM ? ?RESPONSE:  OK ?

## 2021-09-22 NOTE — Progress Notes (Signed)
Okay to treat today with HGB 6.8 and creatinine 1.94 per Dr. Alen Blew. ?

## 2021-09-22 NOTE — Patient Instructions (Addendum)
[facility]  Discharge Instructions: ?Thank you for choosing [facility] to provide your oncology and hematology care.  ? ?If you have a lab appointment with the [facility], please go directly to the [facility] and check in at the registration area. ?  ?Wear comfortable clothing and clothing appropriate for easy access to any Portacath or PICC line.  ? ?We strive to give you quality time with your provider. You may need to reschedule your appointment if you arrive late (15 or more minutes).  Arriving late affects you and other patients whose appointments are after yours.  Also, if you miss three or more appointments without notifying the office, you may be dismissed from the clinic at the provider?s discretion.    ?  ?For prescription refill requests, have your pharmacy contact our office and allow 72 hours for refills to be completed.   ? ?Today you received the following chemotherapy and/or immunotherapy agents: Keytruda. ?  ?To help prevent nausea and vomiting after your treatment, we encourage you to take your nausea medication as directed. ? ?BELOW ARE SYMPTOMS THAT SHOULD BE REPORTED IMMEDIATELY: ?*FEVER GREATER THAN 100.4 F (38 ?C) OR HIGHER ?*CHILLS OR SWEATING ?*NAUSEA AND VOMITING THAT IS NOT CONTROLLED WITH YOUR NAUSEA MEDICATION ?*UNUSUAL SHORTNESS OF BREATH ?*UNUSUAL BRUISING OR BLEEDING ?*URINARY PROBLEMS (pain or burning when urinating, or frequent urination) ?*BOWEL PROBLEMS (unusual diarrhea, constipation, pain near the anus) ?TENDERNESS IN MOUTH AND THROAT WITH OR WITHOUT PRESENCE OF ULCERS (sore throat, sores in mouth, or a toothache) ?UNUSUAL RASH, SWELLING OR PAIN  ?UNUSUAL VAGINAL DISCHARGE OR ITCHING  ? ?Items with * indicate a potential emergency and should be followed up as soon as possible or go to the Emergency Department if any problems should occur. ? ?Please show the CHEMOTHERAPY ALERT CARD or IMMUNOTHERAPY ALERT CARD at check-in to the  Emergency Department and triage nurse. ? ?Should you have questions after your visit or need to cancel or reschedule your appointment, please contact [facility]  Dept: [phone]  and follow the prompts.  Office hours are 8:00 a.m. to 4:30 p.m. Monday - Friday. Please note that voicemails left after 4:00 p.m. may not be returned until the following business day.  We are closed weekends and major holidays. You have access to a nurse at all times for urgent questions. Please call the main number to the clinic Dept: [phone] and follow the prompts. ? ? ?For any non-urgent questions, you may also contact your provider using MyChart. We now offer e-Visits for anyone 34 and older to request care online for non-urgent symptoms. For details visit [email or web address]. ?  ?Also download the MyChart app! Go to the app store, search "MyChart", open the app, select [facility], and log in with your MyChart username and password. ? ?Due to Covid, a mask is required upon entering the hospital/clinic. If you do not have a mask, one will be given to you upon arrival. For doctor visits, patients may have 1 support person aged 71 or older with them. For treatment visits, patients cannot have anyone with them due to current Covid guidelines and our immunocompromised population.  ?Pembrolizumab injection ?What is this medication? ?PEMBROLIZUMAB (pem broe liz ue mab) is a monoclonal antibody. It is used to treat certain types of cancer. ?This medicine may be used for other purposes; ask your health care provider or pharmacist if you have questions. ?COMMON BRAND NAME(S): Keytruda ?What should I tell my  care team before I take this medication? ?They need to know if you have any of these conditions: ?autoimmune diseases like Crohn's disease, ulcerative colitis, or lupus ?have had or planning to have an allogeneic stem cell transplant (uses someone else's stem cells) ?history of organ  transplant ?history of chest radiation ?nervous system problems like myasthenia gravis or Guillain-Barre syndrome ?an unusual or allergic reaction to pembrolizumab, other medicines, foods, dyes, or preservatives ?pregnant or trying to get pregnant ?breast-feeding ?How should I use this medication? ?This medicine is for infusion into a vein. It is given by a health care professional in a hospital or clinic setting. ?A special MedGuide will be given to you before each treatment. Be sure to read this information carefully each time. ?Talk to your pediatrician regarding the use of this medicine in children. While this drug may be prescribed for children as young as 6 months for selected conditions, precautions do apply. ?Overdosage: If you think you have taken too much of this medicine contact a poison control center or emergency room at once. ?NOTE: This medicine is only for you. Do not share this medicine with others. ?What if I miss a dose? ?It is important not to miss your dose. Call your doctor or health care professional if you are unable to keep an appointment. ?What may interact with this medication? ?Interactions have not been studied. ?This list may not describe all possible interactions. Give your health care provider a list of all the medicines, herbs, non-prescription drugs, or dietary supplements you use. Also tell them if you smoke, drink alcohol, or use illegal drugs. Some items may interact with your medicine. ?What should I watch for while using this medication? ?Your condition will be monitored carefully while you are receiving this medicine. ?You may need blood work done while you are taking this medicine. ?Do not become pregnant while taking this medicine or for 4 months after stopping it. Women should inform their doctor if they wish to become pregnant or think they might be pregnant. There is a potential for serious side effects to an unborn child. Talk to your health care professional or  pharmacist for more information. Do not breast-feed an infant while taking this medicine or for 4 months after the last dose. ?What side effects may I notice from receiving this medication? ?Side effects that you should report to your doctor or health care professional as soon as possible: ?allergic reactions like skin rash, itching or hives, swelling of the face, lips, or tongue ?bloody or black, tarry ?breathing problems ?changes in vision ?chest pain ?chills ?confusion ?constipation ?cough ?diarrhea ?dizziness or feeling faint or lightheaded ?fast or irregular heartbeat ?fever ?flushing ?joint pain ?low blood counts - this medicine may decrease the number of white blood cells, red blood cells and platelets. You may be at increased risk for infections and bleeding. ?muscle pain ?muscle weakness ?pain, tingling, numbness in the hands or feet ?persistent headache ?redness, blistering, peeling or loosening of the skin, including inside the mouth ?signs and symptoms of high blood sugar such as dizziness; dry mouth; dry skin; fruity breath; nausea; stomach pain; increased hunger or thirst; increased urination ?signs and symptoms of kidney injury like trouble passing urine or change in the amount of urine ?signs and symptoms of liver injury like dark urine, light-colored stools, loss of appetite, nausea, right upper belly pain, yellowing of the eyes or skin ?sweating ?swollen lymph nodes ?weight loss ?Side effects that usually do not require medical attention (report to your  doctor or health care professional if they continue or are bothersome): ?decreased appetite ?hair loss ?tiredness ?This list may not describe all possible side effects. Call your doctor for medical advice about side effects. You may report side effects to FDA at 1-800-FDA-1088. ?Where should I keep my medication? ?This drug is given in a hospital or clinic and will not be stored at home. ?NOTE: This sheet is a summary. It may not cover all possible  information. If you have questions about this medicine, talk to your doctor, pharmacist, or health care provider. ?? 2022 Elsevier/Gold Standard (2021-02-16 00:00:00) ?Epoetin Alfa injection ?What is this medication? ?EPOE

## 2021-09-22 NOTE — Progress Notes (Signed)
Hematology and Oncology Follow Up Visit ? ?Kevin Morgan ?[id number] ?1951-04-07 71 y.o. ?09/22/2021 11:01 AM ?Kevin Morgan, MDVaradarajan, Morgan,*  ? 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  He has metastatic disease to pelvic lymphadenopathy. ? ? ?Prior Therapy: ? ?He is status post a cystoscopy and a biopsy on April 06, 2021 which confirmed the presence of high-grade urothelial carcinoma with sarcomatoid component. ? ?Gemcitabine chemotherapy given on August 17, 2021.  Therapy discontinued because of his renal failure. ? ?Current therapy: Pembrolizumab 200 mg every 3 weeks set to start on September 22, 2021. ? ?Interim History: [name] returns today for a follow-up visit.  Since the last visit, he reports feeling well and continues to improve.  He was hospitalized in March 2023 for worsening renal failure as well as nausea and vomiting.  Since his discharge he noted improvement in his performance status and activity level.  He denies any fevers chills sweats.  He denies any hematochezia or melena. ? ? ? ? ?Medications: I have reviewed the patient's current medications.  ?Current Outpatient Medications  ?Medication Sig Dispense Refill  ? acetaminophen (TYLENOL) 650 MG CR tablet Take 650 mg by mouth every 8 (eight) hours as needed for pain.    ? calcium-vitamin D (OSCAL WITH D) 500-5 MG-MCG tablet Take 1 tablet by mouth daily with breakfast.    ? Cholecalciferol (VITAMIN D3) 1.25 MG (50000 UT) CAPS Take by mouth daily.    ? clotrimazole-betamethasone (LOTRISONE) lotion Apply 1 application. topically in the morning and at bedtime.    ? cyanocobalamin 1000 MCG tablet Take 100 mcg by mouth daily.    ? famotidine (PEPCID) 20 MG tablet Take 20 mg by mouth daily.    ? fexofenadine (ALLEGRA) 180 MG tablet Take 180 mg by mouth daily.    ? glimepiride (AMARYL) 2 MG tablet Take 2 mg by mouth daily.    ? hydrocortisone  (ANUSOL-HC) 2.5 % rectal cream Apply 1 application. topically 2 (two) times daily.    ? lidocaine-prilocaine (EMLA) cream Apply 1 application topically as needed. 30 g 0  ? loratadine (CLARITIN) 10 MG tablet Take 10 mg by mouth daily.    ? LUMIGAN 0.01 % SOLN Place 1 drop into both eyes at bedtime.    ? metFORMIN (GLUCOPHAGE) 500 MG tablet Take 1,000 mg by mouth 2 (two) times daily.     ? prochlorperazine (COMPAZINE) 10 MG tablet Take 1 tablet (10 mg total) by mouth every 6 (six) hours as needed for nausea or vomiting. (Patient not taking: Reported on 08/28/2021) 30 tablet 0  ? simvastatin (ZOCOR) 20 MG tablet Take 20 mg by mouth daily.    ? sodium bicarbonate 650 MG tablet Take 650 mg by mouth 2 (two) times daily.    ? sodium chloride (OCEAN) 0.65 % SOLN nasal spray Place 1 spray into both nostrils as needed for congestion.    ? tadalafil (CIALIS) 5 MG tablet Take 5 mg by mouth daily.    ? tamsulosin (FLOMAX) 0.4 MG CAPS capsule Take 0.4 mg by mouth at bedtime.  11  ? ?No current facility-administered medications for this visit.  ? ? ? ?Allergies:  ?Allergies  ?Allergen Reactions  ? Cinnamon Other (See Comments)  ?  Lips and eye swelling.   ? Hydrocodone-Acetaminophen Swelling  ? Naprosyn [Naproxen] Hives  ? ? ? ? ?Physical Exam: ?Blood pressure 124/65, pulse 70, temperature 97.9 ?F (36.6 ?C), temperature source Temporal,  resp. rate 18, height '5\' 7"'$  (1.702 m), weight 246 lb 3.2 oz (111.7 kg), SpO2 100 %. ? ?ECOG: 1 ? ? ? ?General appearance: Comfortable appearing without any discomfort ?Head: Normocephalic without any trauma ?Oropharynx: Mucous membranes are moist and pink without any thrush or ulcers. ?Eyes: Pupils are equal and round reactive to light. ?Lymph nodes: No cervical, supraclavicular, inguinal or axillary lymphadenopathy.   ?Heart:regular rate and rhythm.  S1 and S2 without leg edema. ?Lung: Clear without any rhonchi or wheezes.  No dullness to percussion. ?Abdomin: Soft, nontender, nondistended with  good bowel sounds.  No hepatosplenomegaly. ?Musculoskeletal: No joint deformity or effusion.  Full range of motion noted. ?Neurological: No deficits noted on motor, sensory and deep tendon reflex exam. ?Skin: No petechial rash or dryness.  Appeared moist.  ? ? ? ? ?Lab Results: ?Lab Results  ?Component Value Date  ? WBC 12.1 (H) 08/30/2021  ? HGB 6.2 (LL) 08/30/2021  ? HCT 19.8 (L) 08/30/2021  ? MCV 65.8 (L) 08/30/2021  ? PLT 391 08/30/2021  ? ?  Chemistry   ?   ?Component Value Date/Time  ? NA 142 08/30/2021 0324  ? K 3.8 08/30/2021 0324  ? CL 118 (H) 08/30/2021 0324  ? CO2 18 (L) 08/30/2021 0324  ? BUN 15 08/30/2021 0324  ? CREATININE 2.75 (H) 08/30/2021 0324  ? CREATININE 5.94 (HH) 08/17/2021 1332  ?    ?Component Value Date/Time  ? CALCIUM 8.1 (L) 08/30/2021 0324  ? ALKPHOS 102 08/29/2021 0820  ? AST 28 08/29/2021 0820  ? AST 8 (L) 08/17/2021 1332  ? ALT 50 (H) 08/29/2021 0820  ? ALT 10 08/17/2021 1332  ? BILITOT 0.4 08/29/2021 0820  ? BILITOT 0.3 08/17/2021 1332  ?  ? ? ? ? ? ?Impression and Plan: ? ? ?71 year old with: ? ?1.  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  He presented with iliac lymphadenopathy.   ?  ?The natural course of his disease was reviewed at this time and treatment choices were reiterated.  He did not tolerate combination chemotherapy due to cytopenias and renal failure and set to start Pembrolizumab.  Risks and benefits of this treatment were discussed.  Autoimmune complications as well as GI toxicity were reiterated.  He is agreeable to proceed. ?  ?  ?2.  IV access: Port-A-Cath currently in place. ?  ?3.  Antiemetics: Compazine is available to him without any residual nausea or vomiting. ?  ?4.  Chronic kidney disease: Multifactorial in nature with urinary obstruction and required Foley catheter.  No hydronephrosis noted.  Baseline creatinine clearance is around 25 cc/min. ?  ?5.  Goals of care: Any treatment is  palliative at this time.  His disease is incurable although aggressive measures are warranted. ? ?6.  Anemia: Multifactorial in nature related to malignancy and renal failure.  He did not receive any transfusion for religious reasons.  Risks and benefits of starting growth factor support in the form of Retacrit was discussed.  Given his renal failure I think he would benefit from improving his hemoglobin.  He is agreeable to proceed and will proceed with that in the near future.  Complications such as thrombosis and hypertension among others were reiterated. ? ?7.  Autoimmune complications: We will continue to monitor including thyroid function.  Pneumonitis, colitis and hepatitis will be monitored. ?  ?8.  Follow-up: In 3 weeks for repeat follow-up. ?  ?30  minutes were spent on  this encounter.  Time was dedicated to reviewing laboratory data, disease status update and outlining future plan of care discussion. ?  ? ? ?Zola Button, MD ?4/12/202311:01 AM ? ?

## 2021-09-23 ENCOUNTER — Telehealth: Payer: Self-pay

## 2021-09-23 ENCOUNTER — Telehealth: Payer: Self-pay | Admitting: *Deleted

## 2021-09-23 NOTE — Telephone Encounter (Signed)
PC to patient, informed him he has upcoming appointments on 10/13/21 - lab at 11:15, MD at 11:45, and infusion at 12:30.  Patient verbalizes understanding, states he is making transportation arrangements. ?

## 2021-09-23 NOTE — Telephone Encounter (Signed)
Kevin Morgan states that he is eating, drinking, and urinating well. He feels really good today. ?He knows to call the office at [phone] if he has any questions or concerns. ?

## 2021-09-23 NOTE — Telephone Encounter (Signed)
-----   Message from Flo Shanks, RN sent at 09/22/2021  3:01 PM EDT ----- ?Regarding: First time Keytruda/ Dr. Alen Blew ?Please call tomorrow to follow up. Thanks ? ?

## 2021-10-05 ENCOUNTER — Other Ambulatory Visit: Payer: Self-pay | Admitting: Oncology

## 2021-10-05 DIAGNOSIS — C689 Malignant neoplasm of urinary organ, unspecified: Secondary | ICD-10-CM

## 2021-10-13 ENCOUNTER — Inpatient Hospital Stay: Payer: Medicare PPO | Attending: Genetic Counselor

## 2021-10-13 ENCOUNTER — Other Ambulatory Visit: Payer: Self-pay

## 2021-10-13 ENCOUNTER — Inpatient Hospital Stay: Payer: Medicare PPO

## 2021-10-13 ENCOUNTER — Inpatient Hospital Stay: Payer: Medicare PPO | Admitting: Oncology

## 2021-10-13 VITALS — BP 113/60 | HR 87 | Temp 97.6°F | Resp 17 | Ht 67.0 in | Wt 246.4 lb

## 2021-10-13 DIAGNOSIS — C669 Malignant neoplasm of unspecified ureter: Secondary | ICD-10-CM

## 2021-10-13 DIAGNOSIS — C680 Malignant neoplasm of urethra: Secondary | ICD-10-CM | POA: Insufficient documentation

## 2021-10-13 DIAGNOSIS — Z95828 Presence of other vascular implants and grafts: Secondary | ICD-10-CM

## 2021-10-13 DIAGNOSIS — C689 Malignant neoplasm of urinary organ, unspecified: Secondary | ICD-10-CM

## 2021-10-13 DIAGNOSIS — Z5112 Encounter for antineoplastic immunotherapy: Secondary | ICD-10-CM | POA: Diagnosis not present

## 2021-10-13 DIAGNOSIS — Z79899 Other long term (current) drug therapy: Secondary | ICD-10-CM | POA: Diagnosis not present

## 2021-10-13 DIAGNOSIS — D631 Anemia in chronic kidney disease: Secondary | ICD-10-CM | POA: Diagnosis not present

## 2021-10-13 DIAGNOSIS — N184 Chronic kidney disease, stage 4 (severe): Secondary | ICD-10-CM | POA: Diagnosis not present

## 2021-10-13 LAB — CBC WITH DIFFERENTIAL (CANCER CENTER ONLY)
Abs Immature Granulocytes: 0.04 10*3/uL (ref 0.00–0.07)
Basophils Absolute: 0 10*3/uL (ref 0.0–0.1)
Basophils Relative: 0 %
Eosinophils Absolute: 0.5 10*3/uL (ref 0.0–0.5)
Eosinophils Relative: 5 %
HCT: 23.9 % — ABNORMAL LOW (ref 39.0–52.0)
Hemoglobin: 7.5 g/dL — ABNORMAL LOW (ref 13.0–17.0)
Immature Granulocytes: 0 %
Lymphocytes Relative: 22 %
Lymphs Abs: 2.3 10*3/uL (ref 0.7–4.0)
MCH: 21.2 pg — ABNORMAL LOW (ref 26.0–34.0)
MCHC: 31.4 g/dL (ref 30.0–36.0)
MCV: 67.7 fL — ABNORMAL LOW (ref 80.0–100.0)
Monocytes Absolute: 0.7 10*3/uL (ref 0.1–1.0)
Monocytes Relative: 7 %
Neutro Abs: 7 10*3/uL (ref 1.7–7.7)
Neutrophils Relative %: 66 %
Platelet Count: 414 10*3/uL — ABNORMAL HIGH (ref 150–400)
RBC: 3.53 MIL/uL — ABNORMAL LOW (ref 4.22–5.81)
RDW: 20.6 % — ABNORMAL HIGH (ref 11.5–15.5)
WBC Count: 10.6 10*3/uL — ABNORMAL HIGH (ref 4.0–10.5)
nRBC: 0 % (ref 0.0–0.2)

## 2021-10-13 LAB — CMP (CANCER CENTER ONLY)
ALT: 11 U/L (ref 0–44)
AST: 12 U/L — ABNORMAL LOW (ref 15–41)
Albumin: 3 g/dL — ABNORMAL LOW (ref 3.5–5.0)
Alkaline Phosphatase: 61 U/L (ref 38–126)
Anion gap: 7 (ref 5–15)
BUN: 26 mg/dL — ABNORMAL HIGH (ref 8–23)
CO2: 18 mmol/L — ABNORMAL LOW (ref 22–32)
Calcium: 9.1 mg/dL (ref 8.9–10.3)
Chloride: 109 mmol/L (ref 98–111)
Creatinine: 2.04 mg/dL — ABNORMAL HIGH (ref 0.61–1.24)
GFR, Estimated: 34 mL/min — ABNORMAL LOW (ref >60)
Glucose, Bld: 112 mg/dL — ABNORMAL HIGH (ref 70–99)
Potassium: 4.7 mmol/L (ref 3.5–5.1)
Sodium: 134 mmol/L — ABNORMAL LOW (ref 135–145)
Total Bilirubin: 0.4 mg/dL (ref 0.3–1.2)
Total Protein: 7.1 g/dL (ref 6.5–8.1)

## 2021-10-13 LAB — TSH: TSH: 1.567 u[IU]/mL (ref 0.350–4.500)

## 2021-10-13 MED ORDER — SODIUM CHLORIDE 0.9 % IV SOLN: Freq: Once | INTRAVENOUS | Status: AC

## 2021-10-13 MED ORDER — SODIUM CHLORIDE 0.9% FLUSH
10.0000 mL | INTRAVENOUS | Status: DC | PRN
  Administered 2021-10-13: 10 mL

## 2021-10-13 MED ORDER — SODIUM CHLORIDE 0.9% FLUSH
10.0000 mL | Freq: Once | INTRAVENOUS | Status: AC
  Administered 2021-10-13: 10 mL

## 2021-10-13 MED ORDER — SODIUM CHLORIDE 0.9 % IV SOLN
200.0000 mg | Freq: Once | INTRAVENOUS | Status: AC
  Administered 2021-10-13: 200 mg via INTRAVENOUS
  Filled 2021-10-13: qty 200

## 2021-10-13 MED ORDER — HEPARIN SOD (PORK) LOCK FLUSH 100 UNIT/ML IV SOLN
500.0000 [IU] | Freq: Once | INTRAVENOUS | Status: AC | PRN
  Administered 2021-10-13: 500 [IU]

## 2021-10-13 MED ORDER — EPOETIN ALFA-EPBX 20000 UNIT/ML IJ SOLN
20000.0000 [IU] | Freq: Once | INTRAMUSCULAR | Status: AC
  Administered 2021-10-13: 20000 [IU] via SUBCUTANEOUS
  Filled 2021-10-13: qty 1

## 2021-10-13 NOTE — Progress Notes (Signed)
Patient has a hemoglobin of 7.5 and Scr of 2.04- Per Dr. Alen Blew it is ok to proceed with treatment today. ?

## 2021-10-13 NOTE — Progress Notes (Signed)
Hematology and Oncology Follow Up Visit ? ?Kevin Morgan Kevin Morgan ?[id number] ?December 17, 1950 71 y.o. ?10/13/2021 12:07 PM ?Kevin Morgan, MDVaradarajan, Rupashree,*  ? ?Principle Diagnosis: 71 year old with urethral cancer diagnosed in October 2022.  He was found to have stage IV high-grade urothelial tumor with pelvic adenopathy. ? ?Prior Therapy: ? ?He is status post a cystoscopy and a biopsy on April 06, 2021 which confirmed the presence of high-grade urothelial carcinoma with sarcomatoid component. ? ?Gemcitabine chemotherapy given on August 17, 2021.  Therapy discontinued because of his renal failure. ? ?Current therapy: Pembrolizumab 200 mg every 3 weeks started on September 22, 2021.  He is here for cycle 2 of therapy. ? ?Interim History: [name] presents today for a follow-up visit.  Since the last visit, he had tolerated Pembrolizumab without any major complaints.  He denies any nausea, vomiting or abdominal pain.  He denies any recent hospitalizations or illnesses.  He denies any skin rashes or lesions.  He denies any worsening diarrhea or respiratory complaints.  His energy and performance status is improved. ? ? ? ? ?Medications: Updated on review. ?Current Outpatient Medications  ?Medication Sig Dispense Refill  ? acetaminophen (TYLENOL) 650 MG CR tablet Take 650 mg by mouth every 8 (eight) hours as needed for pain.    ? calcium-vitamin D (OSCAL WITH D) 500-5 MG-MCG tablet Take 1 tablet by mouth daily with breakfast.    ? Cholecalciferol (VITAMIN D3) 1.25 MG (50000 UT) CAPS Take by mouth daily.    ? clotrimazole-betamethasone (LOTRISONE) lotion Apply 1 application. topically in the morning and at bedtime.    ? cyanocobalamin 1000 MCG tablet Take 100 mcg by mouth daily.    ? famotidine (PEPCID) 20 MG tablet Take 20 mg by mouth daily.    ? fexofenadine (ALLEGRA) 180 MG tablet Take 180 mg by mouth daily.    ? glimepiride (AMARYL) 2 MG tablet Take 2 mg by mouth daily.    ? hydrocortisone (ANUSOL-HC) 2.5 % rectal  cream Apply 1 application. topically 2 (two) times daily.    ? lidocaine-prilocaine (EMLA) cream Apply 1 application topically as needed. 30 g 0  ? loratadine (CLARITIN) 10 MG tablet Take 10 mg by mouth daily.    ? LUMIGAN 0.01 % SOLN Place 1 drop into both eyes at bedtime.    ? metFORMIN (GLUCOPHAGE) 500 MG tablet Take 1,000 mg by mouth 2 (two) times daily.     ? prochlorperazine (COMPAZINE) 10 MG tablet TAKE 1 TABLET BY MOUTH EVERY 6 HOURS AS NEEDED FOR NAUSEA OR VOMITING. 30 tablet 0  ? simvastatin (ZOCOR) 20 MG tablet Take 20 mg by mouth daily.    ? sodium bicarbonate 650 MG tablet Take 650 mg by mouth 2 (two) times daily.    ? sodium chloride (OCEAN) 0.65 % SOLN nasal spray Place 1 spray into both nostrils as needed for congestion.    ? tadalafil (CIALIS) 5 MG tablet Take 5 mg by mouth daily.    ? tamsulosin (FLOMAX) 0.4 MG CAPS capsule Take 0.4 mg by mouth at bedtime.  11  ? ?No current facility-administered medications for this visit.  ? ? ? ?Allergies:  ?Allergies  ?Allergen Reactions  ? Cinnamon Other (See Comments)  ?  Lips and eye swelling.   ? Hydrocodone-Acetaminophen Swelling  ? Naprosyn [Naproxen] Hives  ? ? ? ? ?Physical Exam: ?Blood pressure 113/60, pulse 87, temperature 97.6 ?F (36.4 ?C), temperature source Axillary, resp. rate 17, height '5\' 7"'$  (1.702 m), weight 246 lb 6.4 oz (  111.8 kg), SpO2 98 %. ? ?ECOG: 1 ? ? ?General appearance: Alert, awake without any distress. ?Head: Atraumatic without abnormalities ?Oropharynx: Without any thrush or ulcers. ?Eyes: No scleral icterus. ?Lymph nodes: No lymphadenopathy noted in the cervical, supraclavicular, or axillary nodes ?Heart:regular rate and rhythm, without any murmurs or gallops.   ?Lung: Clear to auscultation without any rhonchi, wheezes or dullness to percussion. ?Abdomin: Soft, nontender without any shifting dullness or ascites. ?Musculoskeletal: No clubbing or cyanosis. ?Neurological: No motor or sensory deficits. ?Skin: No rashes or  lesions. ? ? ? ? ? ?Lab Results: ?Lab Results  ?Component Value Date  ? WBC 10.6 (H) 10/13/2021  ? HGB 7.5 (L) 10/13/2021  ? HCT 23.9 (L) 10/13/2021  ? MCV 67.7 (L) 10/13/2021  ? PLT 414 (H) 10/13/2021  ? ?  Chemistry   ?   ?Component Value Date/Time  ? NA 139 09/22/2021 1136  ? K 4.2 09/22/2021 1136  ? CL 111 09/22/2021 1136  ? CO2 21 (L) 09/22/2021 1136  ? BUN 17 09/22/2021 1136  ? CREATININE 1.94 (H) 09/22/2021 1136  ?    ?Component Value Date/Time  ? CALCIUM 9.0 09/22/2021 1136  ? ALKPHOS 72 09/22/2021 1136  ? AST 10 (L) 09/22/2021 1136  ? ALT 11 09/22/2021 1136  ? BILITOT 0.3 09/22/2021 1136  ?  ? ? ? ? ? ?Impression and Plan: ? ? ?71 year old with: ? ?1.  Urethral tumor diagnosed in October 2022.  He was found to have stage IV high-grade tumor with pelvic adenopathy. ? ?He is currently on Pembrolizumab which she has tolerated very well without any complaints.  Risks and benefits of continuing this treatment were reviewed at this time.  Complications including autoimmune issues, GI toxicity among others were reiterated.  The plan is to update his staging scan after cycle 4. ?  ?  ?2.  IV access: Port-A-Cath remains in place and currently in use without any issues. ?  ?3.  Antiemetics: No nausea or vomiting reported at this time. ?  ?4.  Chronic kidney disease: His creatinine clearance continues to be around 36 cc/min.  No further decline noted. ?  ?5.  Goals of care: Therapy is palliative at this time though aggressive measures are warranted. ? ?6.  Anemia: Related to renal failure and malignancy.  His hemoglobin is adequate at this time.  He is not consented for blood transfusion for religious reasons. ? ?7.  Autoimmune complications: He has not experienced any at this time.  These include pneumonitis, colitis and thyroid disease. ?  ?8.  Follow-up: In 3 months for a follow-up visit. ?  ?30  minutes were spent on this visit.  The time was dedicated to reviewing laboratory data, disease status update, reviewing  complications related to cancer, cancer therapy and future plan of care. ?  ? ? ?Zola Button, MD ?5/3/202312:07 PM ? ?

## 2021-10-13 NOTE — Patient Instructions (Signed)
[facility]  Discharge Instructions: ?Thank you for choosing [facility] to provide your oncology and hematology care.  ? ?If you have a lab appointment with the [facility], please go directly to the [facility] and check in at the registration area. ?  ?Wear comfortable clothing and clothing appropriate for easy access to any Portacath or PICC line.  ? ?We strive to give you quality time with your provider. You may need to reschedule your appointment if you arrive late (15 or more minutes).  Arriving late affects you and other patients whose appointments are after yours.  Also, if you miss three or more appointments without notifying the office, you may be dismissed from the clinic at the provider?s discretion.    ?  ?For prescription refill requests, have your pharmacy contact our office and allow 72 hours for refills to be completed.   ? ?Today you received the following chemotherapy and/or immunotherapy agents: Keytruda, epogen ?  ?To help prevent nausea and vomiting after your treatment, we encourage you to take your nausea medication as directed. ? ?BELOW ARE SYMPTOMS THAT SHOULD BE REPORTED IMMEDIATELY: ?*FEVER GREATER THAN 100.4 F (38 ?C) OR HIGHER ?*CHILLS OR SWEATING ?*NAUSEA AND VOMITING THAT IS NOT CONTROLLED WITH YOUR NAUSEA MEDICATION ?*UNUSUAL SHORTNESS OF BREATH ?*UNUSUAL BRUISING OR BLEEDING ?*URINARY PROBLEMS (pain or burning when urinating, or frequent urination) ?*BOWEL PROBLEMS (unusual diarrhea, constipation, pain near the anus) ?TENDERNESS IN MOUTH AND THROAT WITH OR WITHOUT PRESENCE OF ULCERS (sore throat, sores in mouth, or a toothache) ?UNUSUAL RASH, SWELLING OR PAIN  ?UNUSUAL VAGINAL DISCHARGE OR ITCHING  ? ?Items with * indicate a potential emergency and should be followed up as soon as possible or go to the Emergency Department if any problems should occur. ? ?Please show the CHEMOTHERAPY ALERT CARD or IMMUNOTHERAPY ALERT CARD at check-in  to the Emergency Department and triage nurse. ? ?Should you have questions after your visit or need to cancel or reschedule your appointment, please contact [facility]  Dept: [phone]  and follow the prompts.  Office hours are 8:00 a.m. to 4:30 p.m. Monday - Friday. Please note that voicemails left after 4:00 p.m. may not be returned until the following business day.  We are closed weekends and major holidays. You have access to a nurse at all times for urgent questions. Please call the main number to the clinic Dept: [phone] and follow the prompts. ? ? ?For any non-urgent questions, you may also contact your provider using MyChart. We now offer e-Visits for anyone 43 and older to request care online for non-urgent symptoms. For details visit [email or web address]. ?  ?Also download the MyChart app! Go to the app store, search "MyChart", open the app, select [facility], and log in with your MyChart username and password. ? ?Due to Covid, a mask is required upon entering the hospital/clinic. If you do not have a mask, one will be given to you upon arrival. For doctor visits, patients may have 1 support person aged 16 or older with them. For treatment visits, patients cannot have anyone with them due to current Covid guidelines and our immunocompromised population.  ?Pembrolizumab injection ?What is this medication? ?PEMBROLIZUMAB (pem broe liz ue mab) is a monoclonal antibody. It is used to treat certain types of cancer. ?This medicine may be used for other purposes; ask your health care provider or pharmacist if you have questions. ?COMMON BRAND NAME(S): Keytruda ?What should I tell  my care team before I take this medication? ?They need to know if you have any of these conditions: ?autoimmune diseases like Crohn's disease, ulcerative colitis, or lupus ?have had or planning to have an allogeneic stem cell transplant (uses someone else's stem cells) ?history of organ  transplant ?history of chest radiation ?nervous system problems like myasthenia gravis or Guillain-Barre syndrome ?an unusual or allergic reaction to pembrolizumab, other medicines, foods, dyes, or preservatives ?pregnant or trying to get pregnant ?breast-feeding ?How should I use this medication? ?This medicine is for infusion into a vein. It is given by a health care professional in a hospital or clinic setting. ?A special MedGuide will be given to you before each treatment. Be sure to read this information carefully each time. ?Talk to your pediatrician regarding the use of this medicine in children. While this drug may be prescribed for children as young as 6 months for selected conditions, precautions do apply. ?Overdosage: If you think you have taken too much of this medicine contact a poison control center or emergency room at once. ?NOTE: This medicine is only for you. Do not share this medicine with others. ?What if I miss a dose? ?It is important not to miss your dose. Call your doctor or health care professional if you are unable to keep an appointment. ?What may interact with this medication? ?Interactions have not been studied. ?This list may not describe all possible interactions. Give your health care provider a list of all the medicines, herbs, non-prescription drugs, or dietary supplements you use. Also tell them if you smoke, drink alcohol, or use illegal drugs. Some items may interact with your medicine. ?What should I watch for while using this medication? ?Your condition will be monitored carefully while you are receiving this medicine. ?You may need blood work done while you are taking this medicine. ?Do not become pregnant while taking this medicine or for 4 months after stopping it. Women should inform their doctor if they wish to become pregnant or think they might be pregnant. There is a potential for serious side effects to an unborn child. Talk to your health care professional or  pharmacist for more information. Do not breast-feed an infant while taking this medicine or for 4 months after the last dose. ?What side effects may I notice from receiving this medication? ?Side effects that you should report to your doctor or health care professional as soon as possible: ?allergic reactions like skin rash, itching or hives, swelling of the face, lips, or tongue ?bloody or black, tarry ?breathing problems ?changes in vision ?chest pain ?chills ?confusion ?constipation ?cough ?diarrhea ?dizziness or feeling faint or lightheaded ?fast or irregular heartbeat ?fever ?flushing ?joint pain ?low blood counts - this medicine may decrease the number of white blood cells, red blood cells and platelets. You may be at increased risk for infections and bleeding. ?muscle pain ?muscle weakness ?pain, tingling, numbness in the hands or feet ?persistent headache ?redness, blistering, peeling or loosening of the skin, including inside the mouth ?signs and symptoms of high blood sugar such as dizziness; dry mouth; dry skin; fruity breath; nausea; stomach pain; increased hunger or thirst; increased urination ?signs and symptoms of kidney injury like trouble passing urine or change in the amount of urine ?signs and symptoms of liver injury like dark urine, light-colored stools, loss of appetite, nausea, right upper belly pain, yellowing of the eyes or skin ?sweating ?swollen lymph nodes ?weight loss ?Side effects that usually do not require medical attention (report to  your doctor or health care professional if they continue or are bothersome): ?decreased appetite ?hair loss ?tiredness ?This list may not describe all possible side effects. Call your doctor for medical advice about side effects. You may report side effects to FDA at 1-800-FDA-1088. ?Where should I keep my medication? ?This drug is given in a hospital or clinic and will not be stored at home. ?NOTE: This sheet is a summary. It may not cover all possible  information. If you have questions about this medicine, talk to your doctor, pharmacist, or health care provider. ?? 2022 Elsevier/Gold Standard (2021-02-16 00:00:00) ?Epoetin Alfa injection ?What is this medication

## 2021-10-18 DIAGNOSIS — G4733 Obstructive sleep apnea (adult) (pediatric): Secondary | ICD-10-CM | POA: Diagnosis not present

## 2021-10-19 ENCOUNTER — Telehealth: Payer: Self-pay | Admitting: Oncology

## 2021-10-19 NOTE — Telephone Encounter (Signed)
Called patient regarding upcoming appointments, patient is notified. 

## 2021-11-04 ENCOUNTER — Telehealth: Payer: Self-pay | Admitting: *Deleted

## 2021-11-04 NOTE — Telephone Encounter (Signed)
Returned PC to patient - he left message stating he has had a lot of gas & has been slightly constipated.  He has taken colace & did have a BM this morning.  Informed patient he may take colace daily if his BM's are hard, he can also take miralax as needed for constipation.  RN also recommended he try mylicon for his gas discomfort.  Advised patient to call this office back if he continues to have this issue, he verbalizes understanding.

## 2021-11-09 ENCOUNTER — Other Ambulatory Visit: Payer: Self-pay

## 2021-11-09 ENCOUNTER — Inpatient Hospital Stay: Payer: Medicare PPO

## 2021-11-09 ENCOUNTER — Inpatient Hospital Stay: Payer: Medicare PPO | Admitting: Oncology

## 2021-11-09 VITALS — BP 112/60 | HR 98 | Temp 97.7°F | Resp 19 | Ht 67.0 in | Wt 235.0 lb

## 2021-11-09 DIAGNOSIS — N184 Chronic kidney disease, stage 4 (severe): Secondary | ICD-10-CM | POA: Diagnosis not present

## 2021-11-09 DIAGNOSIS — C669 Malignant neoplasm of unspecified ureter: Secondary | ICD-10-CM

## 2021-11-09 DIAGNOSIS — Z95828 Presence of other vascular implants and grafts: Secondary | ICD-10-CM

## 2021-11-09 DIAGNOSIS — C680 Malignant neoplasm of urethra: Secondary | ICD-10-CM | POA: Diagnosis not present

## 2021-11-09 DIAGNOSIS — Z5112 Encounter for antineoplastic immunotherapy: Secondary | ICD-10-CM | POA: Diagnosis not present

## 2021-11-09 DIAGNOSIS — Z79899 Other long term (current) drug therapy: Secondary | ICD-10-CM | POA: Diagnosis not present

## 2021-11-09 DIAGNOSIS — C689 Malignant neoplasm of urinary organ, unspecified: Secondary | ICD-10-CM | POA: Diagnosis not present

## 2021-11-09 DIAGNOSIS — D631 Anemia in chronic kidney disease: Secondary | ICD-10-CM | POA: Diagnosis not present

## 2021-11-09 LAB — CMP (CANCER CENTER ONLY)
ALT: 6 U/L (ref 0–44)
AST: 7 U/L — ABNORMAL LOW (ref 15–41)
Albumin: 3.7 g/dL (ref 3.5–5.0)
Alkaline Phosphatase: 66 U/L (ref 38–126)
Anion gap: 7 (ref 5–15)
BUN: 25 mg/dL — ABNORMAL HIGH (ref 8–23)
CO2: 17 mmol/L — ABNORMAL LOW (ref 22–32)
Calcium: 10 mg/dL (ref 8.9–10.3)
Chloride: 110 mmol/L (ref 98–111)
Creatinine: 2.19 mg/dL — ABNORMAL HIGH (ref 0.61–1.24)
GFR, Estimated: 31 mL/min — ABNORMAL LOW (ref >60)
Glucose, Bld: 116 mg/dL — ABNORMAL HIGH (ref 70–99)
Potassium: 4.2 mmol/L (ref 3.5–5.1)
Sodium: 134 mmol/L — ABNORMAL LOW (ref 135–145)
Total Bilirubin: 0.4 mg/dL (ref 0.3–1.2)
Total Protein: 7.8 g/dL (ref 6.5–8.1)

## 2021-11-09 LAB — CBC WITH DIFFERENTIAL (CANCER CENTER ONLY)
Abs Immature Granulocytes: 0.07 10*3/uL (ref 0.00–0.07)
Basophils Absolute: 0 10*3/uL (ref 0.0–0.1)
Basophils Relative: 0 %
Eosinophils Absolute: 0.4 10*3/uL (ref 0.0–0.5)
Eosinophils Relative: 3 %
HCT: 26.1 % — ABNORMAL LOW (ref 39.0–52.0)
Hemoglobin: 8.1 g/dL — ABNORMAL LOW (ref 13.0–17.0)
Immature Granulocytes: 1 %
Lymphocytes Relative: 23 %
Lymphs Abs: 2.6 10*3/uL (ref 0.7–4.0)
MCH: 20.8 pg — ABNORMAL LOW (ref 26.0–34.0)
MCHC: 31 g/dL (ref 30.0–36.0)
MCV: 67.1 fL — ABNORMAL LOW (ref 80.0–100.0)
Monocytes Absolute: 1.1 10*3/uL — ABNORMAL HIGH (ref 0.1–1.0)
Monocytes Relative: 9 %
Neutro Abs: 7.6 10*3/uL (ref 1.7–7.7)
Neutrophils Relative %: 64 %
Platelet Count: 444 10*3/uL — ABNORMAL HIGH (ref 150–400)
RBC: 3.89 MIL/uL — ABNORMAL LOW (ref 4.22–5.81)
RDW: 19.4 % — ABNORMAL HIGH (ref 11.5–15.5)
WBC Count: 11.8 10*3/uL — ABNORMAL HIGH (ref 4.0–10.5)
nRBC: 0 % (ref 0.0–0.2)

## 2021-11-09 LAB — TSH: TSH: 0.956 u[IU]/mL (ref 0.350–4.500)

## 2021-11-09 MED ORDER — EPOETIN ALFA-EPBX 20000 UNIT/ML IJ SOLN
20000.0000 [IU] | Freq: Once | INTRAMUSCULAR | Status: AC
  Administered 2021-11-09: 20000 [IU] via SUBCUTANEOUS
  Filled 2021-11-09: qty 1

## 2021-11-09 MED ORDER — SODIUM CHLORIDE 0.9 % IV SOLN: Freq: Once | INTRAVENOUS | Status: AC

## 2021-11-09 MED ORDER — SODIUM CHLORIDE 0.9% FLUSH
10.0000 mL | INTRAVENOUS | Status: DC | PRN
  Administered 2021-11-09: 10 mL

## 2021-11-09 MED ORDER — SODIUM CHLORIDE 0.9% FLUSH
10.0000 mL | Freq: Once | INTRAVENOUS | Status: AC
  Administered 2021-11-09: 10 mL

## 2021-11-09 MED ORDER — SODIUM CHLORIDE 0.9 % IV SOLN
200.0000 mg | Freq: Once | INTRAVENOUS | Status: AC
  Administered 2021-11-09: 200 mg via INTRAVENOUS
  Filled 2021-11-09: qty 200

## 2021-11-09 MED ORDER — HEPARIN SOD (PORK) LOCK FLUSH 100 UNIT/ML IV SOLN
500.0000 [IU] | Freq: Once | INTRAVENOUS | Status: AC | PRN
  Administered 2021-11-09: 500 [IU]

## 2021-11-09 NOTE — Progress Notes (Signed)
Per Dr. Alen Blew, ok to treat with creatine 2.19 mg/dL today

## 2021-11-09 NOTE — Patient Instructions (Signed)
[facility]  Discharge Instructions: Thank you for choosing [facility] to provide your oncology and hematology care.   If you have a lab appointment with the [facility], please go directly to the [facility] and check in at the registration area.   Wear comfortable clothing and clothing appropriate for easy access to any Portacath or PICC line.   We strive to give you quality time with your provider. You may need to reschedule your appointment if you arrive late (15 or more minutes).  Arriving late affects you and other patients whose appointments are after yours.  Also, if you miss three or more appointments without notifying the office, you may be dismissed from the clinic at the provider's discretion.      For prescription refill requests, have your pharmacy contact our office and allow 72 hours for refills to be completed.    Today you received the following chemotherapy and/or immunotherapy agents: Keytruda, retacrit      To help prevent nausea and vomiting after your treatment, we encourage you to take your nausea medication as directed.  BELOW ARE SYMPTOMS THAT SHOULD BE REPORTED IMMEDIATELY: *FEVER GREATER THAN 100.4 F (38 C) OR HIGHER *CHILLS OR SWEATING *NAUSEA AND VOMITING THAT IS NOT CONTROLLED WITH YOUR NAUSEA MEDICATION *UNUSUAL SHORTNESS OF BREATH *UNUSUAL BRUISING OR BLEEDING *URINARY PROBLEMS (pain or burning when urinating, or frequent urination) *BOWEL PROBLEMS (unusual diarrhea, constipation, pain near the anus) TENDERNESS IN MOUTH AND THROAT WITH OR WITHOUT PRESENCE OF ULCERS (sore throat, sores in mouth, or a toothache) UNUSUAL RASH, SWELLING OR PAIN  UNUSUAL VAGINAL DISCHARGE OR ITCHING   Items with * indicate a potential emergency and should be followed up as soon as possible or go to the Emergency Department if any problems should occur.  Please show the CHEMOTHERAPY ALERT CARD or IMMUNOTHERAPY ALERT CARD at  check-in to the Emergency Department and triage nurse.  Should you have questions after your visit or need to cancel or reschedule your appointment, please contact [facility]  Dept: [phone]  and follow the prompts.  Office hours are 8:00 a.m. to 4:30 p.m. Monday - Friday. Please note that voicemails left after 4:00 p.m. may not be returned until the following business day.  We are closed weekends and major holidays. You have access to a nurse at all times for urgent questions. Please call the main number to the clinic Dept: [phone] and follow the prompts.   For any non-urgent questions, you may also contact your provider using MyChart. We now offer e-Visits for anyone 24 and older to request care online for non-urgent symptoms. For details visit [email or web address].   Also download the MyChart app! Go to the app store, search "MyChart", open the app, select [facility], and log in with your MyChart username and password.  Due to Covid, a mask is required upon entering the hospital/clinic. If you do not have a mask, one will be given to you upon arrival. For doctor visits, patients may have 1 support person aged 69 or older with them. For treatment visits, patients cannot have anyone with them due to current Covid guidelines and our immunocompromised population.

## 2021-11-09 NOTE — Progress Notes (Signed)
Hematology and Oncology Follow Up Visit  Kevin Morgan [id number] 14-May-1951 71 y.o. 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     Prior Therapy:  He is status post a cystoscopy and a biopsy on April 06, 2021 which confirmed the presence of high-grade urothelial carcinoma with sarcomatoid component.  Gemcitabine chemotherapy given on August 17, 2021.  Therapy discontinued because of his renal failure.  Current therapy: Pembrolizumab 200 mg every 3 weeks started on September 22, 2021.  He is here for cycle 2 of therapy.  He is here for cycle 3 of therapy.  Interim History: [name] returns today for a follow-up visit.  Since the last visit, he reports feeling well without any major complaints.  He has tolerated the last treatment without any complaints.  He denies any nausea, vomiting or abdominal pain.  He denies any recent hospitalizations or illnesses.  He denies excessive fatigue, tiredness or weakness.  He denies any falls or syncope.  He denies any changes in bowel habits or diarrhea.     Medications: Reviewed without changes. Current Outpatient Medications  Medication Sig Dispense Refill   acetaminophen (TYLENOL) 650 MG CR tablet Take 650 mg by mouth every 8 (eight) hours as needed for pain.     calcium-vitamin D (OSCAL WITH D) 500-5 MG-MCG tablet Take 1 tablet by mouth daily with breakfast.     Cholecalciferol (VITAMIN D3) 1.25 MG (50000 UT) CAPS Take by mouth daily.     clotrimazole-betamethasone (LOTRISONE) lotion Apply 1 application. topically in the morning and at bedtime.     cyanocobalamin 1000 MCG tablet Take 100 mcg by mouth daily.     famotidine (PEPCID) 20 MG tablet Take 20 mg by mouth daily.     fexofenadine (ALLEGRA) 180 MG tablet Take 180 mg by mouth daily.     glimepiride (AMARYL) 2 MG tablet Take  2 mg by mouth daily.     hydrocortisone (ANUSOL-HC) 2.5 % rectal cream Apply 1 application. topically 2 (two) times daily.     lidocaine-prilocaine (EMLA) cream Apply 1 application topically as needed. 30 g 0   loratadine (CLARITIN) 10 MG tablet Take 10 mg by mouth daily.     LUMIGAN 0.01 % SOLN Place 1 drop into both eyes at bedtime.     metFORMIN (GLUCOPHAGE) 500 MG tablet Take 1,000 mg by mouth 2 (two) times daily.      prochlorperazine (COMPAZINE) 10 MG tablet TAKE 1 TABLET BY MOUTH EVERY 6 HOURS AS NEEDED FOR NAUSEA OR VOMITING. 30 tablet 0   simvastatin (ZOCOR) 20 MG tablet Take 20 mg by mouth daily.     sodium bicarbonate 650 MG tablet Take 650 mg by mouth 2 (two) times daily.     sodium chloride (OCEAN) 0.65 % SOLN nasal spray Place 1 spray into both nostrils as needed for congestion.     tadalafil (CIALIS) 5 MG tablet Take 5 mg by mouth daily.     tamsulosin (FLOMAX) 0.4 MG CAPS capsule Take 0.4 mg by mouth at bedtime.  11   No current facility-administered medications for this visit.     Allergies:  Allergies  Allergen Reactions   Cinnamon Other (See Comments)    Lips and eye swelling.    Hydrocodone-Acetaminophen Swelling   Naprosyn [Naproxen] Hives      Physical Exam:  Blood pressure 112/60, pulse 98, temperature 97.7  F (36.5 C), temperature source Axillary, resp. rate 19, height '5\' 7"'$  (1.702 m), weight 235 lb (106.6 kg), SpO2 100 %.  ECOG: 1    General appearance: Comfortable appearing without any discomfort Head: Normocephalic without any trauma Oropharynx: Mucous membranes are moist and pink without any thrush or ulcers. Eyes: Pupils are equal and round reactive to light. Lymph nodes: No cervical, supraclavicular, inguinal or axillary lymphadenopathy.   Heart:regular rate and rhythm.  S1 and S2 without leg edema. Lung: Clear without any rhonchi or wheezes.  No dullness to percussion. Abdomin: Soft, nontender, nondistended with good bowel sounds.  No  hepatosplenomegaly. Musculoskeletal: No joint deformity or effusion.  Full range of motion noted. Neurological: No deficits noted on motor, sensory and deep tendon reflex exam. Skin: No petechial rash or dryness.  Appeared moist.       Lab Results: Lab Results  Component Value Date   WBC 10.6 (H) 10/13/2021   HGB 7.5 (L) 10/13/2021   HCT 23.9 (L) 10/13/2021   MCV 67.7 (L) 10/13/2021   PLT 414 (H) 10/13/2021     Chemistry      Component Value Date/Time   NA 134 (L) 10/13/2021 1133   K 4.7 10/13/2021 1133   CL 109 10/13/2021 1133   CO2 18 (L) 10/13/2021 1133   BUN 26 (H) 10/13/2021 1133   CREATININE 2.04 (H) 10/13/2021 1133      Component Value Date/Time   CALCIUM 9.1 10/13/2021 1133   ALKPHOS 61 10/13/2021 1133   AST 12 (L) 10/13/2021 1133   ALT 11 10/13/2021 1133   BILITOT 0.4 10/13/2021 1133         Impression and Plan:   71 year old with:  1.  Stage IV high-grade urothelial carcinoma arising from the urethra presented with pelvic adenopathy.    He continues to tolerate Pembrolizumab without any major complaints.  Risks and benefits of continuing this treatments were discussed.  Complications that include GI complaints, autoimmune considerations of this.  Plan is to update his staging scan after cycle 4 of therapy.  He is agreeable to proceed.     2.  IV access: Port-A-Cath remains in place and accessed without any issues.   3.  Antiemetics: Compazine is available to him without any nausea or vomiting.   4.  Chronic kidney disease: His kidney function remained stable with creatinine clearance around 35 cc/min.   5.  Goals of care: His disease is incurable although aggressive measures are warranted given his reasonable performance status.  6.  Anemia: Related to kidney disease as well as malignancy.  He is currently on Retacrit every 3 weeks.  His hemoglobin is improving slowly.  7.  Autoimmune complications: I continue to educate him about potential  complications including pneumonitis, colitis and thyroid disease.   8.  Follow-up: He will return in 3 weeks for the next cycle of therapy.   30  minutes were spent on this visit.  The time was dedicated to reviewing laboratory data, disease status update and outlining future plan of care review.     Zola Button, MD 5/30/202311:53 AM

## 2021-11-16 DIAGNOSIS — R338 Other retention of urine: Secondary | ICD-10-CM | POA: Diagnosis not present

## 2021-11-16 DIAGNOSIS — C680 Malignant neoplasm of urethra: Secondary | ICD-10-CM | POA: Diagnosis not present

## 2021-11-22 ENCOUNTER — Other Ambulatory Visit: Payer: Self-pay | Admitting: Oncology

## 2021-11-30 DIAGNOSIS — H401131 Primary open-angle glaucoma, bilateral, mild stage: Secondary | ICD-10-CM | POA: Diagnosis not present

## 2021-12-01 ENCOUNTER — Other Ambulatory Visit: Payer: Self-pay

## 2021-12-01 ENCOUNTER — Inpatient Hospital Stay: Payer: Medicare PPO | Attending: Genetic Counselor

## 2021-12-01 ENCOUNTER — Inpatient Hospital Stay: Payer: Medicare PPO

## 2021-12-01 ENCOUNTER — Inpatient Hospital Stay: Payer: Medicare PPO | Admitting: Oncology

## 2021-12-01 VITALS — BP 121/62 | HR 98 | Temp 97.5°F | Resp 18 | Ht 67.0 in | Wt 233.6 lb

## 2021-12-01 DIAGNOSIS — N184 Chronic kidney disease, stage 4 (severe): Secondary | ICD-10-CM | POA: Insufficient documentation

## 2021-12-01 DIAGNOSIS — C680 Malignant neoplasm of urethra: Secondary | ICD-10-CM | POA: Insufficient documentation

## 2021-12-01 DIAGNOSIS — C669 Malignant neoplasm of unspecified ureter: Secondary | ICD-10-CM

## 2021-12-01 DIAGNOSIS — Z5112 Encounter for antineoplastic immunotherapy: Secondary | ICD-10-CM | POA: Insufficient documentation

## 2021-12-01 DIAGNOSIS — D631 Anemia in chronic kidney disease: Secondary | ICD-10-CM | POA: Diagnosis not present

## 2021-12-01 DIAGNOSIS — C689 Malignant neoplasm of urinary organ, unspecified: Secondary | ICD-10-CM

## 2021-12-01 DIAGNOSIS — Z95828 Presence of other vascular implants and grafts: Secondary | ICD-10-CM

## 2021-12-01 DIAGNOSIS — Z79899 Other long term (current) drug therapy: Secondary | ICD-10-CM | POA: Insufficient documentation

## 2021-12-01 LAB — CBC WITH DIFFERENTIAL (CANCER CENTER ONLY)
Abs Immature Granulocytes: 0.02 10*3/uL (ref 0.00–0.07)
Basophils Absolute: 0 10*3/uL (ref 0.0–0.1)
Basophils Relative: 0 %
Eosinophils Absolute: 0.3 10*3/uL (ref 0.0–0.5)
Eosinophils Relative: 3 %
HCT: 25.2 % — ABNORMAL LOW (ref 39.0–52.0)
Hemoglobin: 8 g/dL — ABNORMAL LOW (ref 13.0–17.0)
Immature Granulocytes: 0 %
Lymphocytes Relative: 21 %
Lymphs Abs: 1.8 10*3/uL (ref 0.7–4.0)
MCH: 21.2 pg — ABNORMAL LOW (ref 26.0–34.0)
MCHC: 31.7 g/dL (ref 30.0–36.0)
MCV: 66.7 fL — ABNORMAL LOW (ref 80.0–100.0)
Monocytes Absolute: 0.8 10*3/uL (ref 0.1–1.0)
Monocytes Relative: 9 %
Neutro Abs: 6 10*3/uL (ref 1.7–7.7)
Neutrophils Relative %: 67 %
Platelet Count: 365 10*3/uL (ref 150–400)
RBC: 3.78 MIL/uL — ABNORMAL LOW (ref 4.22–5.81)
RDW: 17.5 % — ABNORMAL HIGH (ref 11.5–15.5)
WBC Count: 9 10*3/uL (ref 4.0–10.5)
nRBC: 0 % (ref 0.0–0.2)

## 2021-12-01 LAB — CMP (CANCER CENTER ONLY)
ALT: 5 U/L (ref 0–44)
AST: 7 U/L — ABNORMAL LOW (ref 15–41)
Albumin: 3.4 g/dL — ABNORMAL LOW (ref 3.5–5.0)
Alkaline Phosphatase: 59 U/L (ref 38–126)
Anion gap: 8 (ref 5–15)
BUN: 34 mg/dL — ABNORMAL HIGH (ref 8–23)
CO2: 18 mmol/L — ABNORMAL LOW (ref 22–32)
Calcium: 10.6 mg/dL — ABNORMAL HIGH (ref 8.9–10.3)
Chloride: 107 mmol/L (ref 98–111)
Creatinine: 4.79 mg/dL (ref 0.61–1.24)
GFR, Estimated: 12 mL/min — ABNORMAL LOW (ref >60)
Glucose, Bld: 103 mg/dL — ABNORMAL HIGH (ref 70–99)
Potassium: 4.3 mmol/L (ref 3.5–5.1)
Sodium: 133 mmol/L — ABNORMAL LOW (ref 135–145)
Total Bilirubin: 0.3 mg/dL (ref 0.3–1.2)
Total Protein: 7.4 g/dL (ref 6.5–8.1)

## 2021-12-01 LAB — TSH: TSH: 0.876 u[IU]/mL (ref 0.350–4.500)

## 2021-12-01 MED ORDER — SODIUM CHLORIDE 0.9 % IV SOLN
200.0000 mg | Freq: Once | INTRAVENOUS | Status: AC
  Administered 2021-12-01: 200 mg via INTRAVENOUS
  Filled 2021-12-01: qty 200

## 2021-12-01 MED ORDER — SODIUM CHLORIDE 0.9 % IV SOLN: Freq: Once | INTRAVENOUS | Status: AC

## 2021-12-01 MED ORDER — SODIUM CHLORIDE 0.9% FLUSH
10.0000 mL | INTRAVENOUS | Status: DC | PRN
  Administered 2021-12-01: 10 mL

## 2021-12-01 MED ORDER — HEPARIN SOD (PORK) LOCK FLUSH 100 UNIT/ML IV SOLN
500.0000 [IU] | Freq: Once | INTRAVENOUS | Status: AC | PRN
  Administered 2021-12-01: 500 [IU]

## 2021-12-01 MED ORDER — EPOETIN ALFA-EPBX 20000 UNIT/ML IJ SOLN
20000.0000 [IU] | Freq: Once | INTRAMUSCULAR | Status: AC
  Administered 2021-12-01: 20000 [IU] via SUBCUTANEOUS
  Filled 2021-12-01: qty 1

## 2021-12-01 MED ORDER — SODIUM CHLORIDE 0.9% FLUSH
10.0000 mL | Freq: Once | INTRAVENOUS | Status: AC
  Administered 2021-12-01: 10 mL

## 2021-12-01 NOTE — Progress Notes (Signed)
Per Dr. Alen Blew, ok to treat with creatine 4.79 mg/dL and calcium 10.6 mg/dL today

## 2021-12-01 NOTE — Patient Instructions (Signed)
[facility]  Discharge Instructions: Thank you for choosing [facility] to provide your oncology and hematology care.   If you have a lab appointment with the [facility], please go directly to the [facility] and check in at the registration area.   Wear comfortable clothing and clothing appropriate for easy access to any Portacath or PICC line.   We strive to give you quality time with your provider. You may need to reschedule your appointment if you arrive late (15 or more minutes).  Arriving late affects you and other patients whose appointments are after yours.  Also, if you miss three or more appointments without notifying the office, you may be dismissed from the clinic at the provider's discretion.      For prescription refill requests, have your pharmacy contact our office and allow 72 hours for refills to be completed.    Today you received the following chemotherapy and/or immunotherapy agent: keytruda, retacrit      To help prevent nausea and vomiting after your treatment, we encourage you to take your nausea medication as directed.  BELOW ARE SYMPTOMS THAT SHOULD BE REPORTED IMMEDIATELY: *FEVER GREATER THAN 100.4 F (38 C) OR HIGHER *CHILLS OR SWEATING *NAUSEA AND VOMITING THAT IS NOT CONTROLLED WITH YOUR NAUSEA MEDICATION *UNUSUAL SHORTNESS OF BREATH *UNUSUAL BRUISING OR BLEEDING *URINARY PROBLEMS (pain or burning when urinating, or frequent urination) *BOWEL PROBLEMS (unusual diarrhea, constipation, pain near the anus) TENDERNESS IN MOUTH AND THROAT WITH OR WITHOUT PRESENCE OF ULCERS (sore throat, sores in mouth, or a toothache) UNUSUAL RASH, SWELLING OR PAIN  UNUSUAL VAGINAL DISCHARGE OR ITCHING   Items with * indicate a potential emergency and should be followed up as soon as possible or go to the Emergency Department if any problems should occur.  Please show the CHEMOTHERAPY ALERT CARD or IMMUNOTHERAPY ALERT CARD at  check-in to the Emergency Department and triage nurse.  Should you have questions after your visit or need to cancel or reschedule your appointment, please contact [facility]  Dept: [phone]  and follow the prompts.  Office hours are 8:00 a.m. to 4:30 p.m. Monday - Friday. Please note that voicemails left after 4:00 p.m. may not be returned until the following business day.  We are closed weekends and major holidays. You have access to a nurse at all times for urgent questions. Please call the main number to the clinic Dept: [phone] and follow the prompts.   For any non-urgent questions, you may also contact your provider using MyChart. We now offer e-Visits for anyone 78 and older to request care online for non-urgent symptoms. For details visit [email or web address].   Also download the MyChart app! Go to the app store, search "MyChart", open the app, select [facility], and log in with your MyChart username and password.  Masks are optional in the cancer centers. If you would like for your care team to wear a mask while they are taking care of you, please let them know. For doctor visits, patients may have with them one support person who is at least 73 years old. At this time, visitors are not allowed in the infusion area.

## 2021-12-01 NOTE — Progress Notes (Signed)
Hematology and Oncology Follow Up Visit  Kevin Morgan [id number] February 09, 1951 71 y.o. 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   Prior Therapy:  He is status post a cystoscopy and a biopsy on April 06, 2021 which confirmed the presence of high-grade urothelial carcinoma with sarcomatoid component.  Gemcitabine chemotherapy given on August 17, 2021.  Therapy discontinued because of his renal failure.  Current therapy: Pembrolizumab 200 mg every 3 weeks started on September 22, 2021.  He is here for cycle 2 of therapy.  He is here for cycle 4 of therapy.  Interim History: [name] presents today for a repeat follow-up.  Since last visit, he reports no major changes in his health.  He denies any recent hospitalizations or illnesses.  He has reported some GI complaints predominantly indigestion and gas but no nausea, vomiting or diarrhea his performance status quality of life remains unchanged.     Medications: Updated on review. Current Outpatient Medications  Medication Sig Dispense Refill   acetaminophen (TYLENOL) 650 MG CR tablet Take 650 mg by mouth every 8 (eight) hours as needed for pain.     calcium-vitamin D (OSCAL WITH D) 500-5 MG-MCG tablet Take 1 tablet by mouth daily with breakfast.     Cholecalciferol (VITAMIN D3) 1.25 MG (50000 UT) CAPS Take by mouth daily.     clotrimazole-betamethasone (LOTRISONE) lotion Apply 1 application. topically in the morning and at bedtime.     cyanocobalamin 1000 MCG tablet Take 100 mcg by mouth daily.     famotidine (PEPCID) 20 MG tablet Take 20 mg by mouth daily.     fexofenadine (ALLEGRA) 180 MG tablet Take 180 mg by mouth daily.     glimepiride (AMARYL) 2 MG tablet Take 2 mg by mouth daily.     hydrocortisone (ANUSOL-HC) 2.5 % rectal cream Apply 1 application.  topically 2 (two) times daily.     lidocaine-prilocaine (EMLA) cream APPLY 1 APPLICATION TOPICALLY AS NEEDED. 30 g 0   loratadine (CLARITIN) 10 MG tablet Take 10 mg by mouth daily.     LUMIGAN 0.01 % SOLN Place 1 drop into both eyes at bedtime.     metFORMIN (GLUCOPHAGE) 500 MG tablet Take 1,000 mg by mouth 2 (two) times daily.      prochlorperazine (COMPAZINE) 10 MG tablet TAKE 1 TABLET BY MOUTH EVERY 6 HOURS AS NEEDED FOR NAUSEA OR VOMITING. 30 tablet 0   simvastatin (ZOCOR) 20 MG tablet Take 20 mg by mouth daily.     sodium bicarbonate 650 MG tablet Take 650 mg by mouth 2 (two) times daily.     sodium chloride (OCEAN) 0.65 % SOLN nasal spray Place 1 spray into both nostrils as needed for congestion.     tadalafil (CIALIS) 5 MG tablet Take 5 mg by mouth daily.     tamsulosin (FLOMAX) 0.4 MG CAPS capsule Take 0.4 mg by mouth at bedtime.  11   No current facility-administered medications for this visit.   Facility-Administered Medications Ordered in Other Visits  Medication Dose Route Frequency Provider Last Rate Last Admin   sodium chloride flush (NS) 0.9 % injection 10 mL  10 mL Intracatheter Once Wyatt Portela, MD         Allergies:  Allergies  Allergen Reactions   Cinnamon Other (See Comments)    Lips and eye swelling.    Hydrocodone-Acetaminophen  Swelling   Naprosyn [Naproxen] Hives      Physical Exam:  Blood pressure 121/62, pulse 98, temperature (!) 97.5 F (36.4 C), temperature source Tympanic, resp. rate 18, height '5\' 7"'$  (1.702 m), weight 233 lb 9.6 oz (106 kg), SpO2 97 %.   ECOG: 1   General appearance: Alert, awake without any distress. Head: Atraumatic without abnormalities Oropharynx: Without any thrush or ulcers. Eyes: No scleral icterus. Lymph nodes: No lymphadenopathy noted in the cervical, supraclavicular, or axillary nodes Heart:regular rate and rhythm, without any murmurs or gallops.   Lung: Clear to auscultation without any rhonchi, wheezes or  dullness to percussion. Abdomin: Soft, nontender without any shifting dullness or ascites. Musculoskeletal: No clubbing or cyanosis. Neurological: No motor or sensory deficits. Skin: No rashes or lesions.       Lab Results: Lab Results  Component Value Date   WBC 11.8 (H) 11/09/2021   HGB 8.1 (L) 11/09/2021   HCT 26.1 (L) 11/09/2021   MCV 67.1 (L) 11/09/2021   PLT 444 (H) 11/09/2021     Chemistry      Component Value Date/Time   NA 134 (L) 11/09/2021 1200   K 4.2 11/09/2021 1200   CL 110 11/09/2021 1200   CO2 17 (L) 11/09/2021 1200   BUN 25 (H) 11/09/2021 1200   CREATININE 2.19 (H) 11/09/2021 1200      Component Value Date/Time   CALCIUM 10.0 11/09/2021 1200   ALKPHOS 66 11/09/2021 1200   AST 7 (L) 11/09/2021 1200   ALT 6 11/09/2021 1200   BILITOT 0.4 11/09/2021 1200         Impression and Plan:   71 year old with:  1.  Urethral cancer diagnosed in October 2022.  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    He has tolerated Pembrolizumab without any major complications.  Risks and benefits of continuing this treatment were discussed.  Alternative treatment choices including antibody drug conjugate or oral targeted therapy will be considered.  I have recommended updating his staging scan in the near future and will assess before the next cycle.  He is agreeable to proceed.     2.  IV access: Port-A-Cath currently in use without any issues.   3.  Antiemetics: No nausea or vomiting reported at this time.  Compazine is available to him.   4.  Chronic kidney disease: Creatinine clearance continues to be stable around 30 cc/min.   5.  Goals of care: His disease is incurable although aggressive measures are warranted given his reasonable performance status.  6.  Anemia: Related to malignancy and chronic kidney disease.  His hemoglobin continues to be stable without any need for growth factor support.  7.  Autoimmune  complications: He has not had any issues including pneumonitis, colitis and thyroid disease.   8.  Follow-up: In 3 weeks for the next cycle of therapy.   30  minutes were dedicated to this encounter.  The time was spent on reviewing laboratory data, disease status update and plan of care discussion.     Zola Button, MD 6/21/202311:23 AM

## 2021-12-02 ENCOUNTER — Other Ambulatory Visit (HOSPITAL_COMMUNITY): Payer: Self-pay | Admitting: Urology

## 2021-12-02 ENCOUNTER — Other Ambulatory Visit: Payer: Self-pay | Admitting: Urology

## 2021-12-02 ENCOUNTER — Encounter: Payer: Self-pay | Admitting: *Deleted

## 2021-12-02 DIAGNOSIS — C680 Malignant neoplasm of urethra: Secondary | ICD-10-CM

## 2021-12-02 DIAGNOSIS — R339 Retention of urine, unspecified: Secondary | ICD-10-CM

## 2021-12-02 NOTE — Progress Notes (Unsigned)
Markus Daft, MD  Roxine Caddy for SPT placement in CT.  Case needs to be done early in am so that patient can be watched during day in case he  needs admission.   [place]

## 2021-12-06 ENCOUNTER — Telehealth: Payer: Self-pay | Admitting: Oncology

## 2021-12-06 ENCOUNTER — Other Ambulatory Visit: Payer: Self-pay | Admitting: Physician Assistant

## 2021-12-06 DIAGNOSIS — N35813 Other membranous urethral stricture, male: Secondary | ICD-10-CM

## 2021-12-07 ENCOUNTER — Other Ambulatory Visit (HOSPITAL_COMMUNITY): Payer: Self-pay | Admitting: Urology

## 2021-12-07 ENCOUNTER — Other Ambulatory Visit: Payer: Self-pay

## 2021-12-07 ENCOUNTER — Encounter (HOSPITAL_COMMUNITY): Payer: Self-pay

## 2021-12-07 ENCOUNTER — Ambulatory Visit (HOSPITAL_COMMUNITY)
Admission: RE | Admit: 2021-12-07 | Discharge: 2021-12-07 | Disposition: A | Discharge status: Home / Self Care | Payer: Medicare PPO | Source: Ambulatory Visit | Attending: Urology | Admitting: Urology

## 2021-12-07 DIAGNOSIS — N35813 Other membranous urethral stricture, male: Secondary | ICD-10-CM | POA: Diagnosis not present

## 2021-12-07 DIAGNOSIS — R339 Retention of urine, unspecified: Secondary | ICD-10-CM | POA: Insufficient documentation

## 2021-12-07 DIAGNOSIS — N3289 Other specified disorders of bladder: Secondary | ICD-10-CM | POA: Insufficient documentation

## 2021-12-07 DIAGNOSIS — R590 Localized enlarged lymph nodes: Secondary | ICD-10-CM | POA: Insufficient documentation

## 2021-12-07 DIAGNOSIS — C680 Malignant neoplasm of urethra: Secondary | ICD-10-CM | POA: Diagnosis not present

## 2021-12-07 DIAGNOSIS — Z8559 Personal history of malignant neoplasm of other urinary tract organ: Secondary | ICD-10-CM | POA: Diagnosis not present

## 2021-12-07 DIAGNOSIS — Z9049 Acquired absence of other specified parts of digestive tract: Secondary | ICD-10-CM | POA: Insufficient documentation

## 2021-12-07 DIAGNOSIS — R5383 Other fatigue: Secondary | ICD-10-CM | POA: Insufficient documentation

## 2021-12-07 LAB — NO BLOOD PRODUCTS

## 2021-12-07 LAB — CBC
HCT: 29.9 % — ABNORMAL LOW (ref 39.0–52.0)
Hemoglobin: 8.9 g/dL — ABNORMAL LOW (ref 13.0–17.0)
MCH: 21.1 pg — ABNORMAL LOW (ref 26.0–34.0)
MCHC: 29.8 g/dL — ABNORMAL LOW (ref 30.0–36.0)
MCV: 71 fL — ABNORMAL LOW (ref 80.0–100.0)
Platelets: 550 10*3/uL — ABNORMAL HIGH (ref 150–400)
RBC: 4.21 MIL/uL — ABNORMAL LOW (ref 4.22–5.81)
RDW: 18.8 % — ABNORMAL HIGH (ref 11.5–15.5)
WBC: 14.8 10*3/uL — ABNORMAL HIGH (ref 4.0–10.5)
nRBC: 0 % (ref 0.0–0.2)

## 2021-12-07 LAB — GLUCOSE, CAPILLARY
Glucose-Capillary: 74 mg/dL (ref 70–99)
Glucose-Capillary: 74 mg/dL (ref 70–99)

## 2021-12-07 LAB — PROTIME-INR
INR: 1.4 — ABNORMAL HIGH (ref 0.8–1.2)
Prothrombin Time: 16.9 seconds — ABNORMAL HIGH (ref 11.4–15.2)

## 2021-12-07 MED ORDER — LIDOCAINE HCL 1 % IJ SOLN
INTRAMUSCULAR | Status: AC
  Filled 2021-12-07: qty 10

## 2021-12-07 MED ORDER — ONDANSETRON HCL 4 MG/2ML IJ SOLN
4.0000 mg | Freq: Once | INTRAMUSCULAR | Status: AC
  Administered 2021-12-07: 4 mg via INTRAVENOUS

## 2021-12-07 MED ORDER — CEFTRIAXONE SODIUM 2 G IV SOLR
INTRAVENOUS | Status: DC | PRN
  Administered 2021-12-07: 2 g via INTRAVENOUS

## 2021-12-07 MED ORDER — FENTANYL CITRATE (PF) 100 MCG/2ML IJ SOLN
INTRAMUSCULAR | Status: AC
  Filled 2021-12-07: qty 4

## 2021-12-07 MED ORDER — MIDAZOLAM HCL 2 MG/2ML IJ SOLN
INTRAMUSCULAR | Status: AC
  Filled 2021-12-07: qty 4

## 2021-12-07 MED ORDER — SODIUM CHLORIDE 0.9 % IV SOLN
INTRAVENOUS | Status: AC
  Filled 2021-12-07: qty 20

## 2021-12-07 MED ORDER — FENTANYL CITRATE (PF) 100 MCG/2ML IJ SOLN
INTRAMUSCULAR | Status: DC | PRN
  Administered 2021-12-07: 25 ug via INTRAVENOUS

## 2021-12-07 MED ORDER — SODIUM CHLORIDE 0.9 % IV SOLN: INTRAVENOUS | Status: DC

## 2021-12-07 MED FILL — Ceftriaxone Sodium For Inj 2 GM: INTRAMUSCULAR | Qty: 20 | Status: AC

## 2021-12-07 NOTE — Sedation Documentation (Signed)
Total of of NS infused into bladder. Fluid coming out of the tip of then penis, around the foley catheter noted. Dr. Lowella Dandy notified.

## 2021-12-07 NOTE — Sedation Documentation (Signed)
Unable to do procedure. As per Dr. Lowella Dandy, Foley catheter reconnected to drain NS from bladder.

## 2021-12-07 NOTE — H&P (Signed)
Chief Complaint: Patient was seen in consultation today for supra pubic catheter placement at the request of Eskridge,Matthew  Referring Physician(s): Eskridge,Matthew  Supervising Physician: Richarda Overlie  Patient Status: [facility] - Out-pt  History of Present Illness: Kevin Morgan is a 71 y.o. male   Urothelial cancer 2022 Follows with Dr Mena Goes and Dr Clelia Croft Sees Dr Clelia Croft 12/15/21 in follow up Pt with Urinary retention Has had foley catheter in place for 1 month per pt Now scheduled for supra pubic catheter placement per Dr Mena Goes order    Past Medical History:  Diagnosis Date   Anal fissure    Anemia    Anesthesia complication 04/01/2021   Patient states that with general anesthesia he has difficulty  waking up and has SOB.   Colon polyps 2008/2011   Complication of anesthesia    Depression    DM type 2    ED (erectile dysfunction)    Family history of breast cancer    Family history of prostate cancer    Glaucoma    bilateral, mild   Hearing loss    Hyperlipidemia    Hypertension    Personal history of prostate cancer    Prostate cancer (HCC) 06/13/2008   seed implants   Proteinuria    Sleep apnea, obstructive    cpap used   Tachycardia    Ureter cancer (HCC)    Urticaria     Past Surgical History:  Procedure Laterality Date   APPENDECTOMY  05/1996   mesh used- ruptured  appendix   COLONOSCOPY WITH PROPOFOL N/A 09/23/2014   Procedure: COLONOSCOPY WITH PROPOFOL;  Surgeon: Charolett Bumpers, MD;  Location: [facility];  Service: Endoscopy;  Laterality: N/A;   CYSTOSCOPY WITH URETHRAL DILATATION N/A 04/06/2021   Procedure: CYSTOSCOPY WITH URETHRAL DILATATION; URETHRAL BIOPSY; DORSAL INCISION; RETROGRADE URETHRAGRAM;  Surgeon: Jerilee Field, MD;  Location: [facility];  Service: Urology;  Laterality: N/A;   INCISIONAL HERNIA REPAIR  05/1996   s/p appendectomy-open   INSERTION PROSTATE RADIATION SEED     >10 yrs ago as of 04/01/2021    IR IMAGING GUIDED PORT INSERTION  08/11/2021    Allergies: Cinnamon, Hydrocodone-acetaminophen, and Naprosyn [naproxen]  Medications: Prior to Admission medications   Medication Sig Start Date End Date Taking? Authorizing Provider  acetaminophen (TYLENOL) 650 MG CR tablet Take 650 mg by mouth every 8 (eight) hours as needed for pain.   Yes [provider]  calcium-vitamin D (OSCAL WITH D) 500-5 MG-MCG tablet Take 1 tablet by mouth daily with breakfast.   Yes [provider]  Cholecalciferol (VITAMIN D3) 1.25 MG (50000 UT) CAPS Take by mouth daily.   Yes [provider]  cyanocobalamin 1000 MCG tablet Take 100 mcg by mouth daily.   Yes [provider]  famotidine (PEPCID) 20 MG tablet Take 20 mg by mouth daily. 08/03/18  Yes [provider]  fexofenadine (ALLEGRA) 180 MG tablet Take 180 mg by mouth daily.   Yes [provider]  metFORMIN (GLUCOPHAGE) 500 MG tablet Take 1,000 mg by mouth 2 (two) times daily.    Yes [provider]  simvastatin (ZOCOR) 20 MG tablet Take 20 mg by mouth daily.   Yes [provider]  sodium bicarbonate 650 MG tablet Take 650 mg by mouth 2 (two) times daily.   Yes [provider]  tadalafil (CIALIS) 5 MG tablet Take 5 mg by mouth daily.   Yes [provider]  tamsulosin (FLOMAX) 0.4 MG CAPS  capsule Take 0.4 mg by mouth at bedtime. 05/15/17  Yes [provider]  clotrimazole-betamethasone (LOTRISONE) lotion Apply 1 application. topically in the morning and at bedtime.    [provider]  glimepiride (AMARYL) 2 MG tablet Take 2 mg by mouth daily. 06/23/21   [provider]  hydrocortisone (ANUSOL-HC) 2.5 % rectal cream Apply 1 application. topically 2 (two) times daily. 08/18/21   [provider]  lidocaine-prilocaine (EMLA) cream APPLY 1 APPLICATION TOPICALLY AS NEEDED. 11/22/21   Benjiman Core, MD  loratadine (CLARITIN) 10 MG tablet Take 10 mg  by mouth daily. 10/30/12   [provider]  LUMIGAN 0.01 % SOLN Place 1 drop into both eyes at bedtime. 08/23/21   [provider]  prochlorperazine (COMPAZINE) 10 MG tablet TAKE 1 TABLET BY MOUTH EVERY 6 HOURS AS NEEDED FOR NAUSEA OR VOMITING. 10/05/21   Benjiman Core, MD  sodium chloride (OCEAN) 0.65 % SOLN nasal spray Place 1 spray into both nostrils as needed for congestion.    [provider]     Family History  Problem Relation Age of Onset   Prostate cancer Father    Breast cancer Sister    Lung cancer Brother    Prostate cancer Maternal Uncle    Prostate cancer Maternal Uncle    Heart Problems Maternal Grandmother    Heart Problems Maternal Grandfather    Heart Problems Paternal Grandmother    Heart Problems Paternal Grandfather    Prostate cancer Nephew 96    Social History   Socioeconomic History   Marital status: Divorced    Spouse name: Not on file   Number of children: Not on file   Years of education: Not on file   Highest education level: Not on file  Occupational History   Not on file  Tobacco Use   Smoking status: Former    Types: Cigarettes    Quit date: 09/07/1968    Years since quitting: 53.2   Smokeless tobacco: Never  Vaping Use   Vaping Use: Never used  Substance and Sexual Activity   Alcohol use: No   Drug use: No   Sexual activity: Not on file  Other Topics Concern   Not on file  Social History Narrative   Not on file   Social Determinants of Health   Financial Resource Strain: Not on file  Food Insecurity: Not on file  Transportation Needs: Not on file  Physical Activity: Not on file  Stress: Not on file  Social Connections: Not on file    Review of Systems: A 12 point ROS discussed and pertinent positives are indicated in the HPI above.  All other systems are negative.  Review of Systems  Constitutional:  Positive for fatigue.  Respiratory:  Negative for cough and shortness of breath.   Cardiovascular:   Negative for chest pain.  Psychiatric/Behavioral:  Negative for behavioral problems and confusion.     Vital Signs: BP 123/64   Pulse (!) 114   Temp 98 F (36.7 C) (Temporal)   Resp 18   Ht 5\' 8"  (1.727 m)   Wt 223 lb (101.2 kg)   SpO2 97%   BMI 33.91 kg/m    Physical Exam Vitals reviewed.  HENT:     Mouth/Throat:     Mouth: Mucous membranes are moist.  Cardiovascular:     Rate and Rhythm: Normal rate and regular rhythm.     Heart sounds: Normal heart sounds.  Pulmonary:     Effort:  Pulmonary effort is normal.     Breath sounds: Normal breath sounds.  Abdominal:     Palpations: Abdomen is soft.  Genitourinary:    Comments: Foley in place Skin:    General: Skin is warm.  Neurological:     Mental Status: He is alert and oriented to person, place, and time.  Psychiatric:        Behavior: Behavior normal.     Imaging: No results found.  Labs:  CBC: Recent Labs    10/13/21 1133 11/09/21 1200 12/01/21 1136 12/07/21 0743  WBC 10.6* 11.8* 9.0 14.8*  HGB 7.5* 8.1* 8.0* 8.9*  HCT 23.9* 26.1* 25.2* 29.9*  PLT 414* 444* 365 550*    COAGS: Recent Labs    12/07/21 0743  INR 1.4*    BMP: Recent Labs    09/22/21 1136 10/13/21 1133 11/09/21 1200 12/01/21 1136  NA 139 134* 134* 133*  K 4.2 4.7 4.2 4.3  CL 111 109 110 107  CO2 21* 18* 17* 18*  GLUCOSE 107* 112* 116* 103*  BUN 17 26* 25* 34*  CALCIUM 9.0 9.1 10.0 10.6*  CREATININE 1.94* 2.04* 2.19* 4.79*  GFRNONAA 36* 34* 31* 12*    LIVER FUNCTION TESTS: Recent Labs    09/22/21 1136 10/13/21 1133 11/09/21 1200 12/01/21 1136  BILITOT 0.3 0.4 0.4 0.3  AST 10* 12* 7* 7*  ALT 11 11 6 5   ALKPHOS 72 61 66 59  PROT 7.4 7.1 7.8 7.4  ALBUMIN 3.2* 3.0* 3.7 3.4*    TUMOR MARKERS: No results for input(s): "AFPTM", "CEA", "CA199", "CHROMGRNA" in the last 8760 hours.  Assessment and Plan:  Urothelial cancer Urinary retention Uro asking for supra pubic catheter placement Scheduled now for  same Pt is aware of catheter placement risks and benefits including but not limited to Infection; bleeding; damage to surrounding structures Agreeable to proceed Consent signed and in chart   Thank you for this interesting consult.  I greatly enjoyed meeting Adarius TRACER PALMS and look forward to participating in their care.  A copy of this report was sent to the requesting provider on this date.  Electronically Signed: Robet Leu, PA-C 12/07/2021, 8:31 AM   I spent a total of  30 Minutes   in face to face in clinical consultation, greater than 50% of which was counseling/coordinating care for supra pubic catheter placement

## 2021-12-10 DIAGNOSIS — Z515 Encounter for palliative care: Secondary | ICD-10-CM | POA: Diagnosis not present

## 2021-12-10 DIAGNOSIS — N1330 Unspecified hydronephrosis: Secondary | ICD-10-CM | POA: Diagnosis not present

## 2021-12-10 DIAGNOSIS — R5381 Other malaise: Secondary | ICD-10-CM | POA: Diagnosis not present

## 2021-12-10 DIAGNOSIS — D649 Anemia, unspecified: Secondary | ICD-10-CM | POA: Diagnosis not present

## 2021-12-10 DIAGNOSIS — K8020 Calculus of gallbladder without cholecystitis without obstruction: Secondary | ICD-10-CM | POA: Diagnosis not present

## 2021-12-10 DIAGNOSIS — E440 Moderate protein-calorie malnutrition: Secondary | ICD-10-CM | POA: Diagnosis not present

## 2021-12-10 DIAGNOSIS — I10 Essential (primary) hypertension: Secondary | ICD-10-CM | POA: Diagnosis not present

## 2021-12-10 DIAGNOSIS — N136 Pyonephrosis: Secondary | ICD-10-CM | POA: Diagnosis not present

## 2021-12-10 DIAGNOSIS — N179 Acute kidney failure, unspecified: Secondary | ICD-10-CM | POA: Diagnosis not present

## 2021-12-10 DIAGNOSIS — R9341 Abnormal radiologic findings on diagnostic imaging of renal pelvis, ureter, or bladder: Secondary | ICD-10-CM | POA: Diagnosis not present

## 2021-12-10 DIAGNOSIS — G9340 Encephalopathy, unspecified: Secondary | ICD-10-CM | POA: Diagnosis not present

## 2021-12-10 DIAGNOSIS — N170 Acute kidney failure with tubular necrosis: Secondary | ICD-10-CM | POA: Diagnosis not present

## 2021-12-10 DIAGNOSIS — R41841 Cognitive communication deficit: Secondary | ICD-10-CM | POA: Diagnosis not present

## 2021-12-10 DIAGNOSIS — J9601 Acute respiratory failure with hypoxia: Secondary | ICD-10-CM | POA: Diagnosis not present

## 2021-12-10 DIAGNOSIS — N390 Urinary tract infection, site not specified: Secondary | ICD-10-CM | POA: Diagnosis not present

## 2021-12-10 DIAGNOSIS — G9341 Metabolic encephalopathy: Secondary | ICD-10-CM | POA: Diagnosis not present

## 2021-12-10 DIAGNOSIS — R262 Difficulty in walking, not elsewhere classified: Secondary | ICD-10-CM | POA: Diagnosis not present

## 2021-12-10 DIAGNOSIS — M6281 Muscle weakness (generalized): Secondary | ICD-10-CM | POA: Diagnosis not present

## 2021-12-10 DIAGNOSIS — E875 Hyperkalemia: Secondary | ICD-10-CM | POA: Diagnosis not present

## 2021-12-10 DIAGNOSIS — R570 Cardiogenic shock: Secondary | ICD-10-CM | POA: Diagnosis not present

## 2021-12-10 DIAGNOSIS — C679 Malignant neoplasm of bladder, unspecified: Secondary | ICD-10-CM | POA: Diagnosis not present

## 2021-12-10 DIAGNOSIS — R9389 Abnormal findings on diagnostic imaging of other specified body structures: Secondary | ICD-10-CM | POA: Diagnosis not present

## 2021-12-10 DIAGNOSIS — R590 Localized enlarged lymph nodes: Secondary | ICD-10-CM | POA: Diagnosis not present

## 2021-12-10 DIAGNOSIS — I517 Cardiomegaly: Secondary | ICD-10-CM | POA: Diagnosis not present

## 2021-12-10 DIAGNOSIS — R6521 Severe sepsis with septic shock: Secondary | ICD-10-CM | POA: Diagnosis not present

## 2021-12-10 DIAGNOSIS — J9690 Respiratory failure, unspecified, unspecified whether with hypoxia or hypercapnia: Secondary | ICD-10-CM | POA: Diagnosis not present

## 2021-12-10 DIAGNOSIS — Z452 Encounter for adjustment and management of vascular access device: Secondary | ICD-10-CM | POA: Diagnosis not present

## 2021-12-10 DIAGNOSIS — R918 Other nonspecific abnormal finding of lung field: Secondary | ICD-10-CM | POA: Diagnosis not present

## 2021-12-10 DIAGNOSIS — Z20822 Contact with and (suspected) exposure to covid-19: Secondary | ICD-10-CM | POA: Diagnosis not present

## 2021-12-10 DIAGNOSIS — E11649 Type 2 diabetes mellitus with hypoglycemia without coma: Secondary | ICD-10-CM | POA: Diagnosis not present

## 2021-12-10 DIAGNOSIS — R0602 Shortness of breath: Secondary | ICD-10-CM | POA: Diagnosis not present

## 2021-12-10 DIAGNOSIS — Z7189 Other specified counseling: Secondary | ICD-10-CM | POA: Diagnosis not present

## 2021-12-10 DIAGNOSIS — R410 Disorientation, unspecified: Secondary | ICD-10-CM | POA: Diagnosis not present

## 2021-12-10 DIAGNOSIS — E872 Acidosis, unspecified: Secondary | ICD-10-CM | POA: Diagnosis not present

## 2021-12-10 DIAGNOSIS — R1312 Dysphagia, oropharyngeal phase: Secondary | ICD-10-CM | POA: Diagnosis not present

## 2021-12-10 DIAGNOSIS — A419 Sepsis, unspecified organism: Secondary | ICD-10-CM | POA: Diagnosis not present

## 2021-12-10 DIAGNOSIS — R6520 Severe sepsis without septic shock: Secondary | ICD-10-CM | POA: Diagnosis not present

## 2021-12-10 DIAGNOSIS — E119 Type 2 diabetes mellitus without complications: Secondary | ICD-10-CM | POA: Diagnosis not present

## 2021-12-10 DIAGNOSIS — N134 Hydroureter: Secondary | ICD-10-CM | POA: Diagnosis not present

## 2021-12-17 ENCOUNTER — Encounter (HOSPITAL_COMMUNITY): Admission: RE | Admit: 2021-12-17 | Payer: Medicare PPO | Source: Ambulatory Visit

## 2021-12-22 ENCOUNTER — Inpatient Hospital Stay: Payer: Medicare PPO

## 2021-12-22 ENCOUNTER — Inpatient Hospital Stay: Payer: Medicare PPO | Admitting: Oncology

## 2021-12-22 ENCOUNTER — Telehealth: Payer: Self-pay | Admitting: *Deleted

## 2021-12-22 NOTE — Telephone Encounter (Signed)
PC to patient about missed appointments this a.m., no answer, left VM - informed patient our scheduling department will be contacting him to reschedule.  Instructed patient to call this office with any questions/concerns, [phone].  Scheduling message sent.

## 2021-12-23 ENCOUNTER — Other Ambulatory Visit: Payer: Self-pay | Admitting: Oncology

## 2021-12-23 ENCOUNTER — Telehealth: Payer: Self-pay | Admitting: Oncology

## 2021-12-23 DIAGNOSIS — C689 Malignant neoplasm of urinary organ, unspecified: Secondary | ICD-10-CM

## 2021-12-23 NOTE — Telephone Encounter (Signed)
.  Called patient to schedule appointment per 7/12 inbasket, patient is aware of date and time.  Pt informed me he had a serious hospital visit and will now require lots of assistance to come in.

## 2021-12-28 DIAGNOSIS — Z992 Dependence on renal dialysis: Secondary | ICD-10-CM | POA: Diagnosis not present

## 2021-12-28 DIAGNOSIS — N186 End stage renal disease: Secondary | ICD-10-CM | POA: Diagnosis not present

## 2021-12-28 DIAGNOSIS — D631 Anemia in chronic kidney disease: Secondary | ICD-10-CM | POA: Diagnosis not present

## 2021-12-28 DIAGNOSIS — R41841 Cognitive communication deficit: Secondary | ICD-10-CM | POA: Diagnosis not present

## 2021-12-28 DIAGNOSIS — N4830 Priapism, unspecified: Secondary | ICD-10-CM | POA: Diagnosis not present

## 2021-12-28 DIAGNOSIS — D649 Anemia, unspecified: Secondary | ICD-10-CM | POA: Diagnosis not present

## 2021-12-28 DIAGNOSIS — I10 Essential (primary) hypertension: Secondary | ICD-10-CM | POA: Diagnosis not present

## 2021-12-28 DIAGNOSIS — Z8546 Personal history of malignant neoplasm of prostate: Secondary | ICD-10-CM | POA: Diagnosis not present

## 2021-12-28 DIAGNOSIS — N390 Urinary tract infection, site not specified: Secondary | ICD-10-CM | POA: Diagnosis not present

## 2021-12-28 DIAGNOSIS — N1330 Unspecified hydronephrosis: Secondary | ICD-10-CM | POA: Diagnosis not present

## 2021-12-28 DIAGNOSIS — J849 Interstitial pulmonary disease, unspecified: Secondary | ICD-10-CM | POA: Diagnosis not present

## 2021-12-28 DIAGNOSIS — N189 Chronic kidney disease, unspecified: Secondary | ICD-10-CM | POA: Diagnosis not present

## 2021-12-28 DIAGNOSIS — Z978 Presence of other specified devices: Secondary | ICD-10-CM | POA: Diagnosis not present

## 2021-12-28 DIAGNOSIS — M6281 Muscle weakness (generalized): Secondary | ICD-10-CM | POA: Diagnosis not present

## 2021-12-28 DIAGNOSIS — R310 Gross hematuria: Secondary | ICD-10-CM | POA: Diagnosis not present

## 2021-12-28 DIAGNOSIS — Z743 Need for continuous supervision: Secondary | ICD-10-CM | POA: Diagnosis not present

## 2021-12-28 DIAGNOSIS — N179 Acute kidney failure, unspecified: Secondary | ICD-10-CM | POA: Diagnosis not present

## 2021-12-28 DIAGNOSIS — R6521 Severe sepsis with septic shock: Secondary | ICD-10-CM | POA: Diagnosis not present

## 2021-12-28 DIAGNOSIS — E119 Type 2 diabetes mellitus without complications: Secondary | ICD-10-CM | POA: Diagnosis not present

## 2021-12-28 DIAGNOSIS — C7919 Secondary malignant neoplasm of other urinary organs: Secondary | ICD-10-CM | POA: Diagnosis not present

## 2021-12-28 DIAGNOSIS — Z466 Encounter for fitting and adjustment of urinary device: Secondary | ICD-10-CM | POA: Diagnosis not present

## 2021-12-28 DIAGNOSIS — G9340 Encephalopathy, unspecified: Secondary | ICD-10-CM | POA: Diagnosis not present

## 2021-12-28 DIAGNOSIS — J9601 Acute respiratory failure with hypoxia: Secondary | ICD-10-CM | POA: Diagnosis not present

## 2021-12-28 DIAGNOSIS — I120 Hypertensive chronic kidney disease with stage 5 chronic kidney disease or end stage renal disease: Secondary | ICD-10-CM | POA: Diagnosis not present

## 2021-12-28 DIAGNOSIS — R58 Hemorrhage, not elsewhere classified: Secondary | ICD-10-CM | POA: Diagnosis not present

## 2021-12-28 DIAGNOSIS — D62 Acute posthemorrhagic anemia: Secondary | ICD-10-CM | POA: Diagnosis not present

## 2021-12-28 DIAGNOSIS — E875 Hyperkalemia: Secondary | ICD-10-CM | POA: Diagnosis not present

## 2021-12-28 DIAGNOSIS — R1312 Dysphagia, oropharyngeal phase: Secondary | ICD-10-CM | POA: Diagnosis not present

## 2021-12-28 DIAGNOSIS — R319 Hematuria, unspecified: Secondary | ICD-10-CM | POA: Diagnosis not present

## 2021-12-28 DIAGNOSIS — R262 Difficulty in walking, not elsewhere classified: Secondary | ICD-10-CM | POA: Diagnosis not present

## 2021-12-28 DIAGNOSIS — A419 Sepsis, unspecified organism: Secondary | ICD-10-CM | POA: Diagnosis not present

## 2021-12-28 DIAGNOSIS — C679 Malignant neoplasm of bladder, unspecified: Secondary | ICD-10-CM | POA: Diagnosis not present

## 2021-12-29 DIAGNOSIS — Z8546 Personal history of malignant neoplasm of prostate: Secondary | ICD-10-CM | POA: Diagnosis not present

## 2021-12-29 DIAGNOSIS — E119 Type 2 diabetes mellitus without complications: Secondary | ICD-10-CM | POA: Diagnosis not present

## 2021-12-29 DIAGNOSIS — J849 Interstitial pulmonary disease, unspecified: Secondary | ICD-10-CM | POA: Diagnosis not present

## 2021-12-29 DIAGNOSIS — R262 Difficulty in walking, not elsewhere classified: Secondary | ICD-10-CM | POA: Diagnosis not present

## 2021-12-29 DIAGNOSIS — R319 Hematuria, unspecified: Secondary | ICD-10-CM | POA: Diagnosis not present

## 2021-12-29 DIAGNOSIS — Z992 Dependence on renal dialysis: Secondary | ICD-10-CM | POA: Diagnosis not present

## 2021-12-29 DIAGNOSIS — R1312 Dysphagia, oropharyngeal phase: Secondary | ICD-10-CM | POA: Diagnosis not present

## 2021-12-29 DIAGNOSIS — A419 Sepsis, unspecified organism: Secondary | ICD-10-CM | POA: Diagnosis not present

## 2021-12-29 DIAGNOSIS — Z978 Presence of other specified devices: Secondary | ICD-10-CM | POA: Diagnosis not present

## 2021-12-29 DIAGNOSIS — N186 End stage renal disease: Secondary | ICD-10-CM | POA: Diagnosis not present

## 2021-12-29 DIAGNOSIS — N4830 Priapism, unspecified: Secondary | ICD-10-CM | POA: Diagnosis not present

## 2021-12-29 DIAGNOSIS — D649 Anemia, unspecified: Secondary | ICD-10-CM | POA: Diagnosis not present

## 2021-12-29 DIAGNOSIS — N189 Chronic kidney disease, unspecified: Secondary | ICD-10-CM | POA: Diagnosis not present

## 2021-12-29 DIAGNOSIS — D62 Acute posthemorrhagic anemia: Secondary | ICD-10-CM | POA: Diagnosis not present

## 2021-12-29 DIAGNOSIS — N179 Acute kidney failure, unspecified: Secondary | ICD-10-CM | POA: Diagnosis not present

## 2021-12-29 DIAGNOSIS — C679 Malignant neoplasm of bladder, unspecified: Secondary | ICD-10-CM | POA: Diagnosis not present

## 2021-12-29 DIAGNOSIS — Z48816 Encounter for surgical aftercare following surgery on the genitourinary system: Secondary | ICD-10-CM | POA: Diagnosis not present

## 2021-12-29 DIAGNOSIS — R310 Gross hematuria: Secondary | ICD-10-CM | POA: Diagnosis not present

## 2021-12-29 DIAGNOSIS — R6521 Severe sepsis with septic shock: Secondary | ICD-10-CM | POA: Diagnosis not present

## 2021-12-29 DIAGNOSIS — Z743 Need for continuous supervision: Secondary | ICD-10-CM | POA: Diagnosis not present

## 2021-12-29 DIAGNOSIS — C678 Malignant neoplasm of overlapping sites of bladder: Secondary | ICD-10-CM | POA: Diagnosis not present

## 2021-12-29 DIAGNOSIS — D631 Anemia in chronic kidney disease: Secondary | ICD-10-CM | POA: Diagnosis not present

## 2021-12-29 DIAGNOSIS — R531 Weakness: Secondary | ICD-10-CM | POA: Diagnosis not present

## 2021-12-29 DIAGNOSIS — M6281 Muscle weakness (generalized): Secondary | ICD-10-CM | POA: Diagnosis not present

## 2021-12-29 DIAGNOSIS — I120 Hypertensive chronic kidney disease with stage 5 chronic kidney disease or end stage renal disease: Secondary | ICD-10-CM | POA: Diagnosis not present

## 2021-12-29 DIAGNOSIS — R58 Hemorrhage, not elsewhere classified: Secondary | ICD-10-CM | POA: Diagnosis not present

## 2021-12-29 DIAGNOSIS — R41841 Cognitive communication deficit: Secondary | ICD-10-CM | POA: Diagnosis not present

## 2021-12-29 DIAGNOSIS — Z466 Encounter for fitting and adjustment of urinary device: Secondary | ICD-10-CM | POA: Diagnosis not present

## 2021-12-29 DIAGNOSIS — Z7401 Bed confinement status: Secondary | ICD-10-CM | POA: Diagnosis not present

## 2021-12-29 DIAGNOSIS — I491 Atrial premature depolarization: Secondary | ICD-10-CM | POA: Diagnosis not present

## 2021-12-29 DIAGNOSIS — Z531 Procedure and treatment not carried out because of patient's decision for reasons of belief and group pressure: Secondary | ICD-10-CM | POA: Diagnosis not present

## 2021-12-29 DIAGNOSIS — C7919 Secondary malignant neoplasm of other urinary organs: Secondary | ICD-10-CM | POA: Diagnosis not present

## 2021-12-29 DIAGNOSIS — N390 Urinary tract infection, site not specified: Secondary | ICD-10-CM | POA: Diagnosis not present

## 2021-12-30 ENCOUNTER — Inpatient Hospital Stay: Payer: Medicare PPO

## 2022-01-03 ENCOUNTER — Other Ambulatory Visit: Payer: Self-pay

## 2022-01-04 ENCOUNTER — Other Ambulatory Visit: Payer: Self-pay

## 2022-01-07 DIAGNOSIS — Z992 Dependence on renal dialysis: Secondary | ICD-10-CM | POA: Diagnosis not present

## 2022-01-07 DIAGNOSIS — L89159 Pressure ulcer of sacral region, unspecified stage: Secondary | ICD-10-CM | POA: Diagnosis not present

## 2022-01-07 DIAGNOSIS — D500 Iron deficiency anemia secondary to blood loss (chronic): Secondary | ICD-10-CM | POA: Diagnosis not present

## 2022-01-07 DIAGNOSIS — Z531 Procedure and treatment not carried out because of patient's decision for reasons of belief and group pressure: Secondary | ICD-10-CM | POA: Diagnosis not present

## 2022-01-07 DIAGNOSIS — T83511A Infection and inflammatory reaction due to indwelling urethral catheter, initial encounter: Secondary | ICD-10-CM | POA: Diagnosis not present

## 2022-01-07 DIAGNOSIS — R531 Weakness: Secondary | ICD-10-CM | POA: Diagnosis not present

## 2022-01-07 DIAGNOSIS — D739 Disease of spleen, unspecified: Secondary | ICD-10-CM | POA: Diagnosis not present

## 2022-01-07 DIAGNOSIS — N186 End stage renal disease: Secondary | ICD-10-CM | POA: Diagnosis not present

## 2022-01-07 DIAGNOSIS — K8020 Calculus of gallbladder without cholecystitis without obstruction: Secondary | ICD-10-CM | POA: Diagnosis not present

## 2022-01-07 DIAGNOSIS — N179 Acute kidney failure, unspecified: Secondary | ICD-10-CM | POA: Diagnosis not present

## 2022-01-07 DIAGNOSIS — N390 Urinary tract infection, site not specified: Secondary | ICD-10-CM | POA: Diagnosis not present

## 2022-01-07 DIAGNOSIS — N401 Enlarged prostate with lower urinary tract symptoms: Secondary | ICD-10-CM | POA: Diagnosis not present

## 2022-01-07 DIAGNOSIS — J9691 Respiratory failure, unspecified with hypoxia: Secondary | ICD-10-CM | POA: Diagnosis not present

## 2022-01-07 DIAGNOSIS — R599 Enlarged lymph nodes, unspecified: Secondary | ICD-10-CM | POA: Diagnosis not present

## 2022-01-07 DIAGNOSIS — R52 Pain, unspecified: Secondary | ICD-10-CM | POA: Diagnosis not present

## 2022-01-07 DIAGNOSIS — A419 Sepsis, unspecified organism: Secondary | ICD-10-CM | POA: Diagnosis not present

## 2022-01-07 DIAGNOSIS — R310 Gross hematuria: Secondary | ICD-10-CM | POA: Diagnosis not present

## 2022-01-07 DIAGNOSIS — E119 Type 2 diabetes mellitus without complications: Secondary | ICD-10-CM | POA: Diagnosis not present

## 2022-01-07 DIAGNOSIS — N2581 Secondary hyperparathyroidism of renal origin: Secondary | ICD-10-CM | POA: Diagnosis not present

## 2022-01-07 DIAGNOSIS — E440 Moderate protein-calorie malnutrition: Secondary | ICD-10-CM | POA: Diagnosis not present

## 2022-01-07 DIAGNOSIS — I120 Hypertensive chronic kidney disease with stage 5 chronic kidney disease or end stage renal disease: Secondary | ICD-10-CM | POA: Diagnosis not present

## 2022-01-07 DIAGNOSIS — R262 Difficulty in walking, not elsewhere classified: Secondary | ICD-10-CM | POA: Diagnosis not present

## 2022-01-07 DIAGNOSIS — G9341 Metabolic encephalopathy: Secondary | ICD-10-CM | POA: Diagnosis not present

## 2022-01-07 DIAGNOSIS — N136 Pyonephrosis: Secondary | ICD-10-CM | POA: Diagnosis not present

## 2022-01-07 DIAGNOSIS — Z48816 Encounter for surgical aftercare following surgery on the genitourinary system: Secondary | ICD-10-CM | POA: Diagnosis not present

## 2022-01-07 DIAGNOSIS — F5101 Primary insomnia: Secondary | ICD-10-CM | POA: Diagnosis not present

## 2022-01-07 DIAGNOSIS — C679 Malignant neoplasm of bladder, unspecified: Secondary | ICD-10-CM | POA: Diagnosis not present

## 2022-01-07 DIAGNOSIS — D649 Anemia, unspecified: Secondary | ICD-10-CM | POA: Diagnosis not present

## 2022-01-07 DIAGNOSIS — R319 Hematuria, unspecified: Secondary | ICD-10-CM | POA: Diagnosis not present

## 2022-01-07 DIAGNOSIS — Z515 Encounter for palliative care: Secondary | ICD-10-CM | POA: Diagnosis not present

## 2022-01-07 DIAGNOSIS — M6281 Muscle weakness (generalized): Secondary | ICD-10-CM | POA: Diagnosis not present

## 2022-01-07 DIAGNOSIS — R1312 Dysphagia, oropharyngeal phase: Secondary | ICD-10-CM | POA: Diagnosis not present

## 2022-01-07 DIAGNOSIS — A4181 Sepsis due to Enterococcus: Secondary | ICD-10-CM | POA: Diagnosis not present

## 2022-01-07 DIAGNOSIS — E875 Hyperkalemia: Secondary | ICD-10-CM | POA: Diagnosis not present

## 2022-01-07 DIAGNOSIS — R4189 Other symptoms and signs involving cognitive functions and awareness: Secondary | ICD-10-CM | POA: Diagnosis not present

## 2022-01-07 DIAGNOSIS — Z7401 Bed confinement status: Secondary | ICD-10-CM | POA: Diagnosis not present

## 2022-01-07 DIAGNOSIS — N1330 Unspecified hydronephrosis: Secondary | ICD-10-CM | POA: Diagnosis not present

## 2022-01-07 DIAGNOSIS — D631 Anemia in chronic kidney disease: Secondary | ICD-10-CM | POA: Diagnosis not present

## 2022-01-07 DIAGNOSIS — R4182 Altered mental status, unspecified: Secondary | ICD-10-CM | POA: Diagnosis not present

## 2022-01-07 DIAGNOSIS — R41841 Cognitive communication deficit: Secondary | ICD-10-CM | POA: Diagnosis not present

## 2022-01-10 DIAGNOSIS — E875 Hyperkalemia: Secondary | ICD-10-CM | POA: Diagnosis not present

## 2022-01-10 DIAGNOSIS — D500 Iron deficiency anemia secondary to blood loss (chronic): Secondary | ICD-10-CM | POA: Diagnosis not present

## 2022-01-10 DIAGNOSIS — R319 Hematuria, unspecified: Secondary | ICD-10-CM | POA: Diagnosis not present

## 2022-01-10 DIAGNOSIS — C679 Malignant neoplasm of bladder, unspecified: Secondary | ICD-10-CM | POA: Diagnosis not present

## 2022-01-12 DIAGNOSIS — A419 Sepsis, unspecified organism: Secondary | ICD-10-CM | POA: Diagnosis not present

## 2022-01-12 DIAGNOSIS — J9691 Respiratory failure, unspecified with hypoxia: Secondary | ICD-10-CM | POA: Diagnosis not present

## 2022-01-12 DIAGNOSIS — N401 Enlarged prostate with lower urinary tract symptoms: Secondary | ICD-10-CM | POA: Diagnosis not present

## 2022-01-12 DIAGNOSIS — R4182 Altered mental status, unspecified: Secondary | ICD-10-CM | POA: Diagnosis not present

## 2022-01-12 DIAGNOSIS — N136 Pyonephrosis: Secondary | ICD-10-CM | POA: Diagnosis not present

## 2022-01-12 DIAGNOSIS — N390 Urinary tract infection, site not specified: Secondary | ICD-10-CM | POA: Diagnosis not present

## 2022-01-12 DIAGNOSIS — N2581 Secondary hyperparathyroidism of renal origin: Secondary | ICD-10-CM | POA: Diagnosis not present

## 2022-01-12 DIAGNOSIS — E440 Moderate protein-calorie malnutrition: Secondary | ICD-10-CM | POA: Diagnosis not present

## 2022-01-12 DIAGNOSIS — G9341 Metabolic encephalopathy: Secondary | ICD-10-CM | POA: Diagnosis not present

## 2022-01-12 DIAGNOSIS — R451 Restlessness and agitation: Secondary | ICD-10-CM | POA: Diagnosis not present

## 2022-01-12 DIAGNOSIS — D739 Disease of spleen, unspecified: Secondary | ICD-10-CM | POA: Diagnosis not present

## 2022-01-12 DIAGNOSIS — R5381 Other malaise: Secondary | ICD-10-CM | POA: Diagnosis not present

## 2022-01-12 DIAGNOSIS — R319 Hematuria, unspecified: Secondary | ICD-10-CM | POA: Diagnosis not present

## 2022-01-12 DIAGNOSIS — L89159 Pressure ulcer of sacral region, unspecified stage: Secondary | ICD-10-CM | POA: Diagnosis not present

## 2022-01-12 DIAGNOSIS — E119 Type 2 diabetes mellitus without complications: Secondary | ICD-10-CM | POA: Diagnosis not present

## 2022-01-12 DIAGNOSIS — D631 Anemia in chronic kidney disease: Secondary | ICD-10-CM | POA: Diagnosis not present

## 2022-01-12 DIAGNOSIS — C679 Malignant neoplasm of bladder, unspecified: Secondary | ICD-10-CM | POA: Diagnosis not present

## 2022-01-12 DIAGNOSIS — Z515 Encounter for palliative care: Secondary | ICD-10-CM | POA: Diagnosis not present

## 2022-01-12 DIAGNOSIS — Z531 Procedure and treatment not carried out because of patient's decision for reasons of belief and group pressure: Secondary | ICD-10-CM | POA: Diagnosis not present

## 2022-01-12 DIAGNOSIS — Z978 Presence of other specified devices: Secondary | ICD-10-CM | POA: Diagnosis not present

## 2022-01-12 DIAGNOSIS — N179 Acute kidney failure, unspecified: Secondary | ICD-10-CM | POA: Diagnosis not present

## 2022-01-12 DIAGNOSIS — R6521 Severe sepsis with septic shock: Secondary | ICD-10-CM | POA: Diagnosis not present

## 2022-01-12 DIAGNOSIS — T83511A Infection and inflammatory reaction due to indwelling urethral catheter, initial encounter: Secondary | ICD-10-CM | POA: Diagnosis not present

## 2022-01-12 DIAGNOSIS — I120 Hypertensive chronic kidney disease with stage 5 chronic kidney disease or end stage renal disease: Secondary | ICD-10-CM | POA: Diagnosis not present

## 2022-01-12 DIAGNOSIS — J9690 Respiratory failure, unspecified, unspecified whether with hypoxia or hypercapnia: Secondary | ICD-10-CM | POA: Diagnosis not present

## 2022-01-12 DIAGNOSIS — N1330 Unspecified hydronephrosis: Secondary | ICD-10-CM | POA: Diagnosis not present

## 2022-01-12 DIAGNOSIS — K8020 Calculus of gallbladder without cholecystitis without obstruction: Secondary | ICD-10-CM | POA: Diagnosis not present

## 2022-01-12 DIAGNOSIS — A4181 Sepsis due to Enterococcus: Secondary | ICD-10-CM | POA: Diagnosis not present

## 2022-01-12 DIAGNOSIS — Z992 Dependence on renal dialysis: Secondary | ICD-10-CM | POA: Diagnosis not present

## 2022-01-12 DIAGNOSIS — Z7189 Other specified counseling: Secondary | ICD-10-CM | POA: Diagnosis not present

## 2022-01-12 DIAGNOSIS — D500 Iron deficiency anemia secondary to blood loss (chronic): Secondary | ICD-10-CM | POA: Diagnosis not present

## 2022-01-12 DIAGNOSIS — R599 Enlarged lymph nodes, unspecified: Secondary | ICD-10-CM | POA: Diagnosis not present

## 2022-01-12 DIAGNOSIS — R52 Pain, unspecified: Secondary | ICD-10-CM | POA: Diagnosis not present

## 2022-01-12 DIAGNOSIS — Z452 Encounter for adjustment and management of vascular access device: Secondary | ICD-10-CM | POA: Diagnosis not present

## 2022-01-12 DIAGNOSIS — N186 End stage renal disease: Secondary | ICD-10-CM | POA: Diagnosis not present

## 2022-01-21 ENCOUNTER — Other Ambulatory Visit: Payer: Self-pay | Admitting: Oncology

## 2022-01-21 DIAGNOSIS — C689 Malignant neoplasm of urinary organ, unspecified: Secondary | ICD-10-CM

## 2022-01-26 ENCOUNTER — Telehealth: Payer: Self-pay | Admitting: *Deleted

## 2022-01-26 ENCOUNTER — Inpatient Hospital Stay: Payer: Medicare PPO

## 2022-01-26 ENCOUNTER — Inpatient Hospital Stay: Payer: Medicare PPO | Admitting: Oncology

## 2022-01-26 NOTE — Telephone Encounter (Signed)
PC to patient regarding missed appointments, no answer, left VM - informed patient our scheduling department will be contacting him, scheduling message sent.

## 2022-01-31 ENCOUNTER — Telehealth: Payer: Self-pay | Admitting: Oncology

## 2022-01-31 NOTE — Telephone Encounter (Signed)
Called to see when pt is able to come in for infusion, left a message to call back so we can schedule

## 2022-02-03 ENCOUNTER — Telehealth: Payer: Self-pay | Admitting: Oncology

## 2022-02-03 NOTE — Telephone Encounter (Signed)
Scheduled per work-queue, called patient and left a voicemail regarding September appointment.

## 2022-02-03 NOTE — Telephone Encounter (Signed)
Contacted patient to scheduled appointments. Left message for patient to give [place] a call back to scheduled those missed appointment. Also instructed patient that at this time we have not rescheduled appointments until we receive a call back.

## 2022-02-04 ENCOUNTER — Other Ambulatory Visit: Payer: Self-pay

## 2022-02-05 ENCOUNTER — Other Ambulatory Visit: Payer: Self-pay

## 2022-02-11 DEATH — deceased

## 2022-02-16 ENCOUNTER — Other Ambulatory Visit: Payer: Medicare PPO

## 2022-02-16 ENCOUNTER — Telehealth: Payer: Self-pay | Admitting: *Deleted

## 2022-02-16 ENCOUNTER — Inpatient Hospital Stay: Payer: Medicare PPO

## 2022-02-16 ENCOUNTER — Inpatient Hospital Stay: Payer: Medicare PPO | Admitting: Oncology

## 2022-02-16 NOTE — Telephone Encounter (Signed)
PC to patient's nephew regarding patient's missed appointments this a.m., nephew states patient passed away on 03/03/2022.  Condolences expressed, Dr Alen Blew informed.

## 2023-08-24 IMAGING — CT NM PET TUM IMG INITIAL (PI) SKULL BASE T - THIGH
1 of 7 series · 1 of 25 positions shown · non-contrast
Comparison: CT chest, abdomen and pelvis dated April 22, 2021

CLINICAL DATA: Initial treatment strategy for urothelial carcinoma.

EXAM:
NUCLEAR MEDICINE PET SKULL BASE TO THIGH
TECHNIQUE: 12.3 mCi F-18 FDG was injected intravenously. Full-ring PET imaging
was performed from the skull base to thigh after the radiotracer. CT
data was obtained and used for attenuation correction and anatomic
localization.
Fasting blood glucose: 140 mg/dl

[Series 4: ct sk_thigh 5.0 hd_fov · axial · 5.0mm · 1.07mm/px · 1 of 207 slices shown]
[im 207/207  brain]
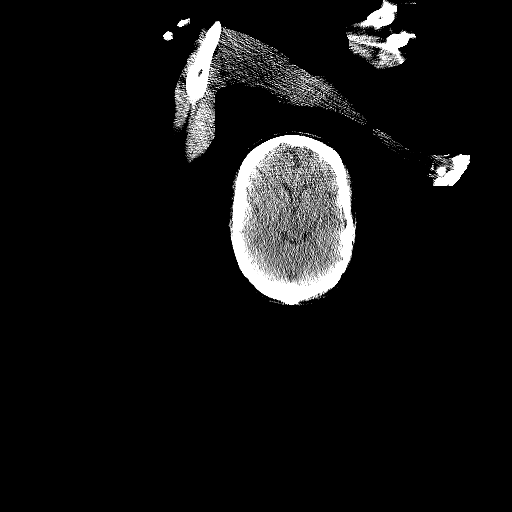

[1 of 25 positions shown; findings below may reference images not displayed]

FINDINGS: Mediastinal blood pool activity: SUV max

Liver activity: SUV max

NECK: No hypermetabolic lymph nodes in the neck.

Incidental CT findings: none

CHEST: No hypermetabolic mediastinal or hilar nodes. No suspicious
pulmonary nodules on the CT scan.

Incidental CT findings: Mild atherosclerotic disease of the thoracic
aorta. Calcifications of the LAD. Cystic lesion of the anterior
mediastinum with no FDG uptake.

ABDOMEN/PELVIS: Enlarged hypermetabolic left pelvic lymph nodes.
Reference left pelvic sidewall lymph node measuring 2.1 cm in short
axis with SUV max 12.3 on series 4, image 147, previously measured
up to 1.6 cm. Hypermetabolic right common iliac lymph node measuring
8 mm with SUV max 6.0 on series 4, image 133, unchanged in size
compared to prior exam. Mildly hypermetabolic lymph nodes are seen
in the inferior pelvis adjacent to the abductor muscles. Reference
right pelvic lymph node measuring 1.0 cm on image 162 with SUV max
of 4.2, unchanged in size when compared to prior exam. Reference
left pelvic lymph node measuring 1.0 cm on image 162 with SUV max of
5.1, unchanged in size compared to the prior exam. Enlarged right
external iliac lymph node measuring 1.4 cm on series 4, image 156
with no significant FDG uptake, SUV max of 2.5. FDG avid soft
tissues measuring approximately 2.8 x 2.2 cm with SUV max of 11.0 is
seen extending posterior to the prostate into the rectum. No
abnormal hypermetabolic activity within the liver, pancreas, adrenal
glands, or spleen.

Incidental CT findings: Exophytic lesion cystic lesion measuring
x 2.4 cm is seen adjacent to the gallbladder which demonstrates no
significant FDG uptake and is unchanged when compared with prior
exam. Atrophic left kidney with nonobstructing stone. Unchanged
cystic splenic lesions which demonstrate no FDG activity.
Prostatomegaly with brachytherapy seeds. Diverticulosis.

SKELETON: No focal hypermetabolic activity to suggest skeletal
metastasis.

Incidental CT findings: none
IMPRESSION: 1. Enlarged and hypermetabolic left pelvic lymph nodes are slightly
increased in size when compared with prior exam, compatible with
metastatic disease.
2. Additional mildly enlarged and mildly FDG avid right common iliac
lymph node and bilateral inferior pelvic lymph nodes located
adjacent to the abductor muscles are unchanged in size when compared
to prior exam, and concerning for additional sites of metastatic
disease.
3. FDG avid soft tissue is seen posterior to the prostate extending
into the rectum, concerning for local invasion.

## 2023-11-09 IMAGING — US IR IMAGING GUIDED PORT INSERTION
1 series · 1 of 1 positions shown · non-contrast
Comparison: none

INDICATION: Malignant neoplasm of ureter, unspecified laterality (HCC)

[Series 1: ir fluoro/shunt/fist · 1 of 1 slices shown]
[im 1/1]
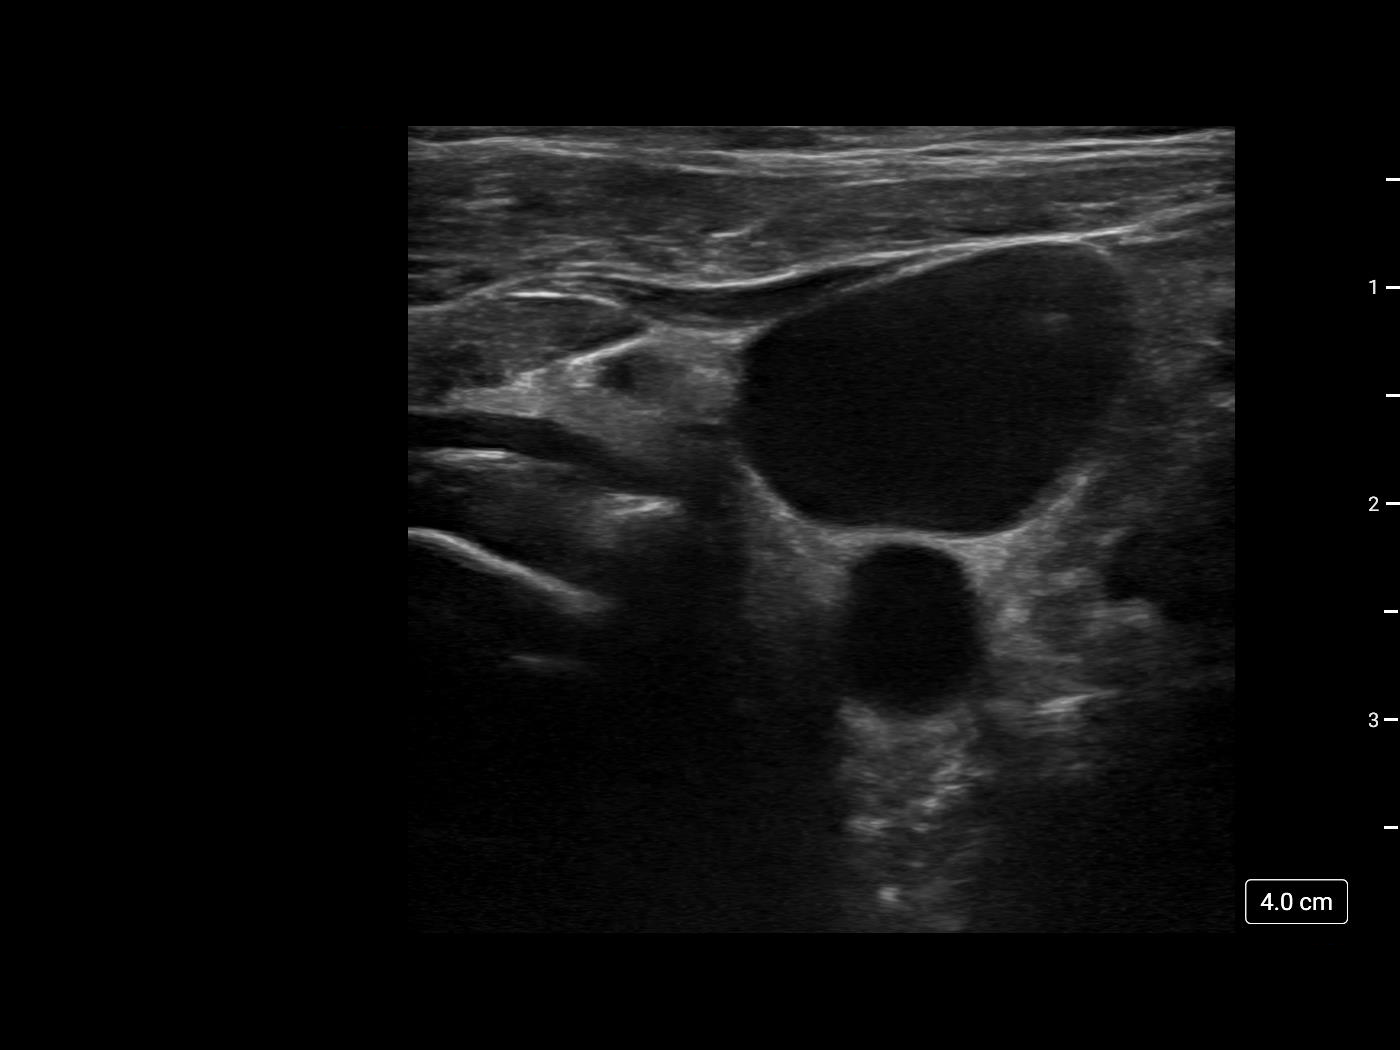

[1 of 1 positions shown; findings below may reference images not displayed]

EXAM:
Ultrasound-guided puncture of the right internal jugular vein

Placement of a right-sided chest port using fluoroscopic guidance

MEDICATIONS:
None

ANESTHESIA/SEDATION:
Moderate (conscious) sedation was employed during this procedure. A
total of Versed 3 mg and Fentanyl 100 mcg was administered
intravenously.

Moderate Sedation Time: 27 minutes. The patient's level of
consciousness and vital signs were monitored continuously by
radiology nursing throughout the procedure under my direct
supervision.

FLUOROSCOPY TIME:  Fluoroscopy Time: 0.8 minutes (4 mGy)

COMPLICATIONS:
None immediate.

PROCEDURE:
Informed written consent was obtained from the patient after a
thorough discussion of the procedural risks, benefits and
alternatives. All questions were addressed. Maximal Sterile Barrier
Technique was utilized including caps, mask, sterile gowns, sterile
gloves, sterile drape, hand hygiene and skin antiseptic. A timeout
was performed prior to the initiation of the procedure.

The patient was placed supine on the exam table. The right neck and
chest was prepped and draped in the standard sterile fashion. A
preliminary ultrasound of the right neck was performed and
demonstrates a patent right internal jugular vein. A permanent
ultrasound image was stored in the electronic medical record. The
overlying skin was anesthetized with 1% Lidocaine. Using ultrasound
guidance, access was obtained into the right internal jugular vein
using a 21 gauge micropuncture set. A wire was advanced into the
SVC, a short incision was made at the puncture site, and serial
dilatation performed. Next, in an ipsilateral infraclavicular
location, an incision was made at the site of the subcutaneous
reservoir. Blunt dissection was used to open a pocket to contain the
reservoir. A subcutaneous tunnel was then created from the port site
to the puncture site. A(n) 8 Fr single lumen catheter was advanced
through the tunnel. The catheter was attached to the port and this
was placed in the subcutaneous pocket. Under fluoroscopic guidance,
a peel away sheath was placed, and the catheter was trimmed to the
appropriate length and was advanced into the central veins. The
catheter length is 24 cm. The tip of the catheter lies near the
superior cavoatrial junction. The port flushes and aspirates
appropriately. The port was flushed and locked with heparinized
saline. The port pocket was closed in 2 layers using 3-0 and 4-0
Vicryl/absorbable suture. Dermabond was also applied to both
incisions. The patient tolerated the procedure well and was
transferred to recovery in stable condition.
IMPRESSION: Successful placement of a right-sided chest port via the right
internal jugular vein. The port is ready for immediate use.

## 2023-11-26 IMAGING — US US RENAL
1 series · 15 of 25 positions shown · non-contrast
Comparison: 03/03/2021, CT 04/22/2021

CLINICAL DATA: Acute renal insufficiency

EXAM:
RENAL / URINARY TRACT ULTRASOUND COMPLETE

[Series 1: us renal mc & wl · 15 of 41 slices shown]
[im 1/41]
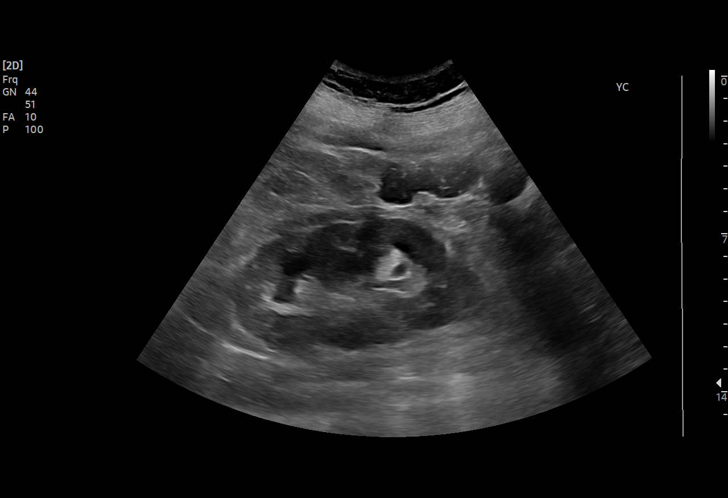
[im 4/41]
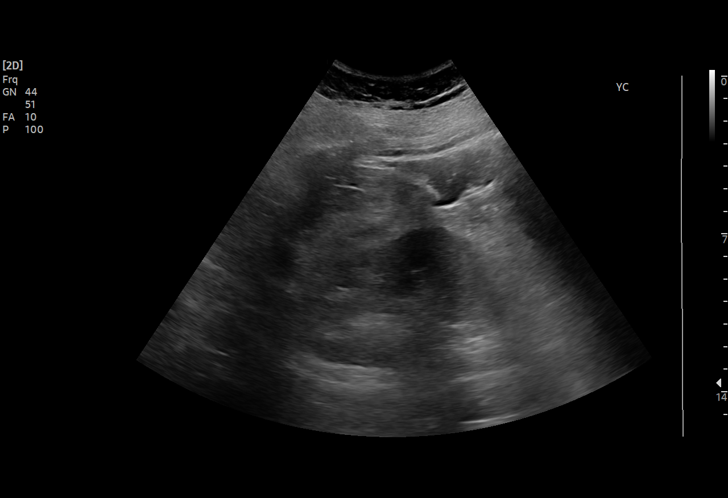
[im 7/41]
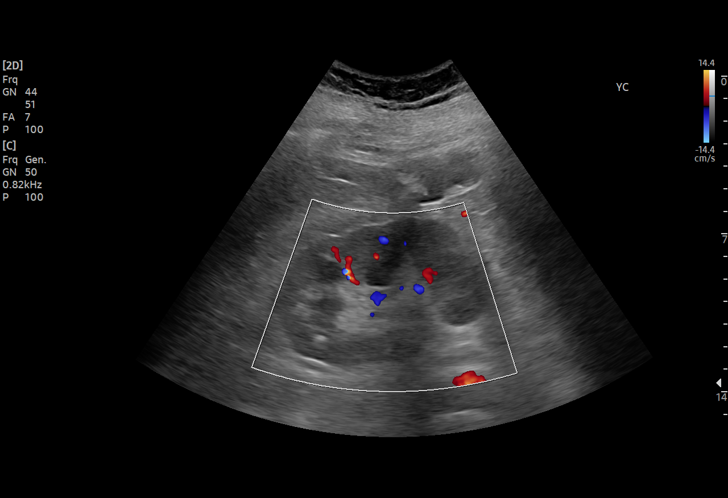
[im 9/41]
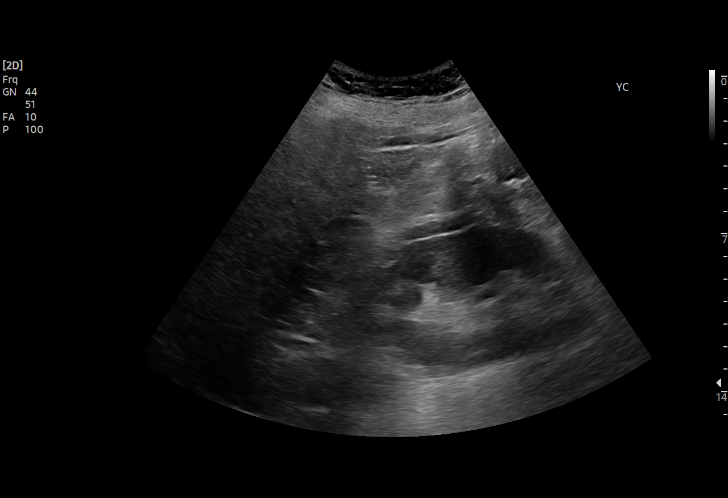
[im 12/41]
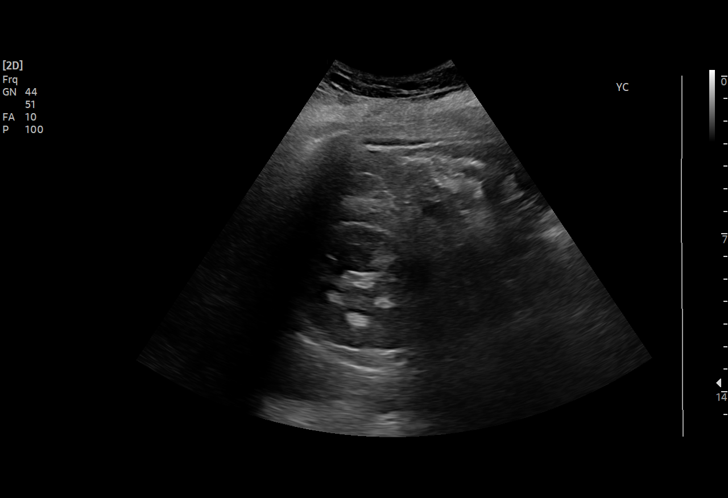
[im 16/41]
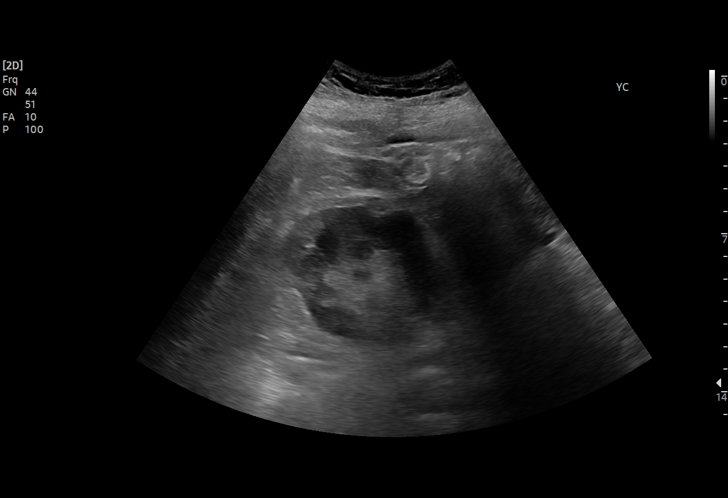
[im 17/41]
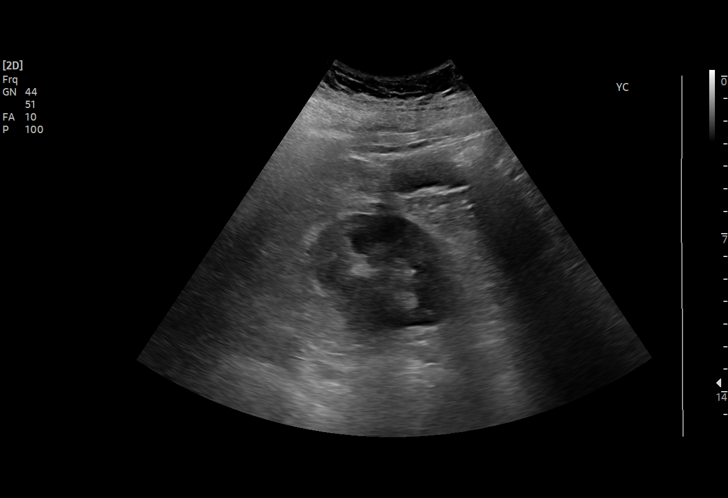
[im 21/41]
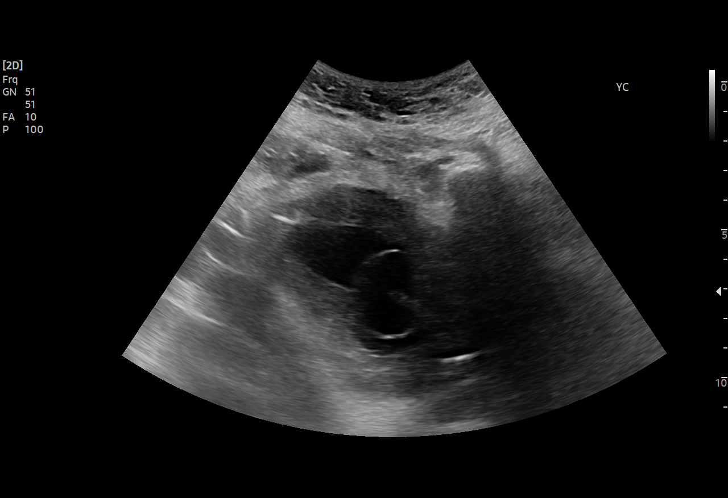
[im 24/41]
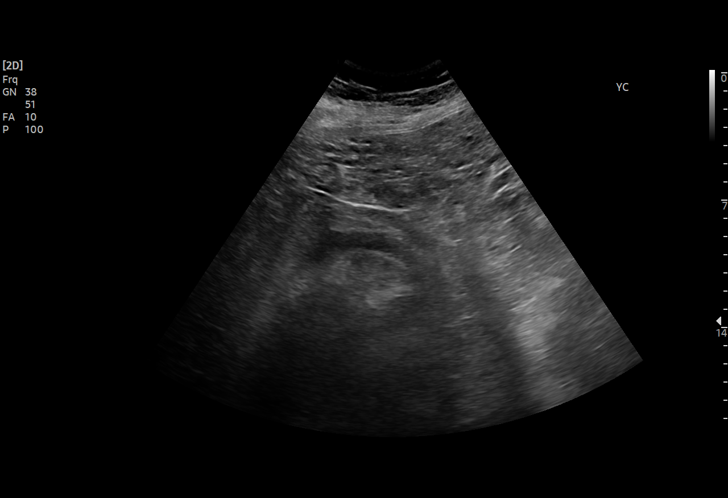
[im 26/41]
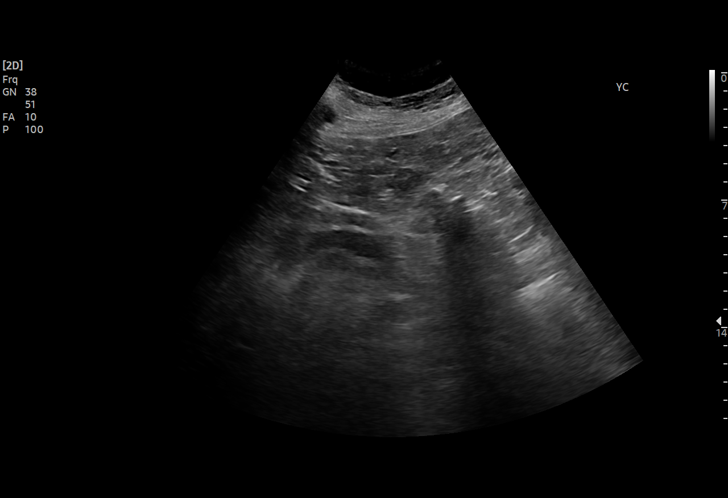
[im 29/41]
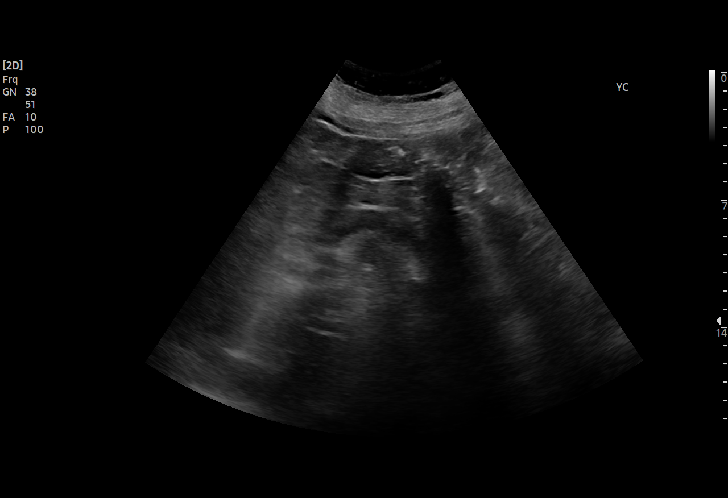
[im 32/41]
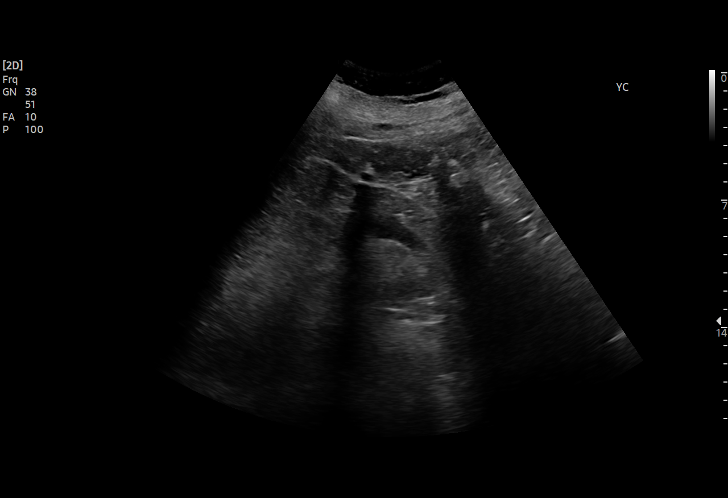
[im 34/41]
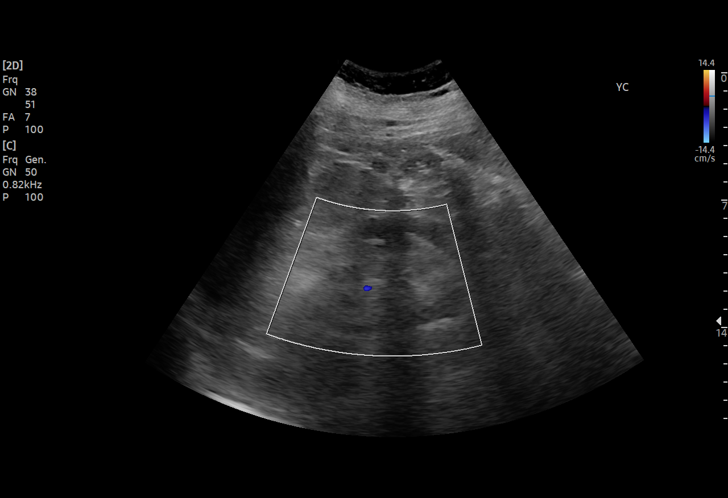
[im 37/41]
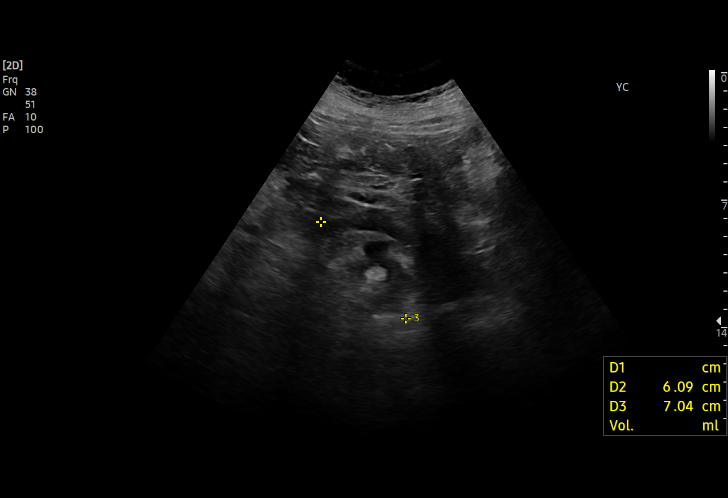
[im 41/41]
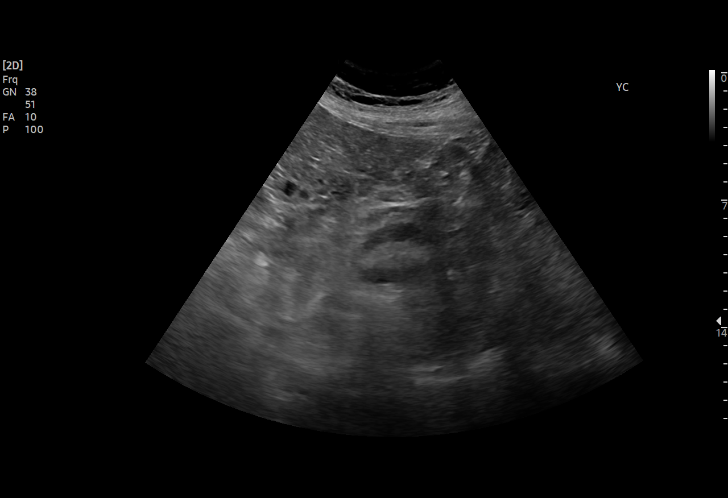

[15 of 25 positions shown; findings below may reference images not displayed]

FINDINGS: Right Kidney:

Renal measurements: 11.0 x 5.7 x 6.5 cm = volume: 213 mL.
Echogenicity within normal limits. Preserved cortical thickness. No
mass or hydronephrosis visualized.

Left Kidney:

Renal measurements: 7.1 x 4.5 by 3.3 cm = volume: 74 mL. There is
marked left renal cortical atrophy, unchanged from prior
examination. There is thickening of the pararenal fat. These
findings are better demonstrated on the prior examination of
03/03/2021. No hydronephrosis. No intrarenal masses or
calcifications.

Bladder:

The bladder is circumferentially thick walled. Foley catheter
balloon is seen within a decompressed bladder lumen.

Other:

None.
IMPRESSION: Marked asymmetric left renal cortical atrophy, stable since prior
examination. No hydronephrosis.

Circumferential bladder wall thickening suggesting changes of
chronic bladder outlet obstruction. Foley catheter balloon noted
within a decompressed bladder lumen.
